# Patient Record
Sex: Female | Born: 1970 | Race: White | Hispanic: No | Smoking: Never smoker
Health system: Southern US, Community
[De-identification: ages and names within clinical notes are randomized; demographics above are authoritative.]

## PROBLEM LIST (undated history)

## (undated) DIAGNOSIS — F32A Depression, unspecified: Secondary | ICD-10-CM

## (undated) DIAGNOSIS — F419 Anxiety disorder, unspecified: Secondary | ICD-10-CM

## (undated) DIAGNOSIS — K219 Gastro-esophageal reflux disease without esophagitis: Secondary | ICD-10-CM

## (undated) DIAGNOSIS — F329 Major depressive disorder, single episode, unspecified: Secondary | ICD-10-CM

## (undated) HISTORY — DX: Major depressive disorder, single episode, unspecified: F32.9

## (undated) HISTORY — PX: ABDOMINAL HYSTERECTOMY: SHX81

## (undated) HISTORY — DX: Depression, unspecified: F32.A

## (undated) HISTORY — PX: CHOLECYSTECTOMY: SHX55

## (undated) HISTORY — DX: Gastro-esophageal reflux disease without esophagitis: K21.9

## (undated) HISTORY — DX: Anxiety disorder, unspecified: F41.9

---

## 2001-06-06 ENCOUNTER — Other Ambulatory Visit: Admission: RE | Admit: 2001-06-06 | Discharge: 2001-06-06 | Payer: Self-pay | Admitting: Family Medicine

## 2001-09-15 ENCOUNTER — Other Ambulatory Visit: Admission: RE | Admit: 2001-09-15 | Discharge: 2001-09-15 | Payer: Self-pay | Admitting: Obstetrics & Gynecology

## 2001-10-31 ENCOUNTER — Ambulatory Visit: Admission: RE | Admit: 2001-10-31 | Discharge: 2001-10-31 | Payer: Self-pay | Admitting: Gynecology

## 2001-11-03 ENCOUNTER — Encounter: Payer: Self-pay | Admitting: Gynecology

## 2001-11-07 ENCOUNTER — Encounter (INDEPENDENT_AMBULATORY_CARE_PROVIDER_SITE_OTHER): Payer: Self-pay | Admitting: Specialist

## 2001-11-07 ENCOUNTER — Inpatient Hospital Stay (HOSPITAL_COMMUNITY): Admission: RE | Admit: 2001-11-07 | Discharge: 2001-11-09 | Payer: Self-pay | Admitting: Obstetrics & Gynecology

## 2001-12-20 ENCOUNTER — Ambulatory Visit: Admission: RE | Admit: 2001-12-20 | Discharge: 2001-12-20 | Payer: Self-pay | Admitting: Gynecology

## 2002-01-19 ENCOUNTER — Encounter: Payer: Self-pay | Admitting: Obstetrics and Gynecology

## 2002-01-19 ENCOUNTER — Ambulatory Visit (HOSPITAL_COMMUNITY): Admission: RE | Admit: 2002-01-19 | Discharge: 2002-01-19 | Payer: Self-pay | Admitting: Obstetrics and Gynecology

## 2002-03-21 ENCOUNTER — Ambulatory Visit: Admission: RE | Admit: 2002-03-21 | Discharge: 2002-03-21 | Payer: Self-pay | Admitting: Gynecology

## 2002-03-21 ENCOUNTER — Other Ambulatory Visit: Admission: RE | Admit: 2002-03-21 | Discharge: 2002-03-21 | Payer: Self-pay | Admitting: Gynecology

## 2002-03-21 ENCOUNTER — Encounter (INDEPENDENT_AMBULATORY_CARE_PROVIDER_SITE_OTHER): Payer: Self-pay | Admitting: *Deleted

## 2002-06-12 ENCOUNTER — Other Ambulatory Visit: Admission: RE | Admit: 2002-06-12 | Discharge: 2002-06-12 | Payer: Self-pay | Admitting: Obstetrics & Gynecology

## 2002-07-11 ENCOUNTER — Encounter (INDEPENDENT_AMBULATORY_CARE_PROVIDER_SITE_OTHER): Payer: Self-pay | Admitting: Specialist

## 2002-07-11 ENCOUNTER — Ambulatory Visit: Admission: RE | Admit: 2002-07-11 | Discharge: 2002-07-11 | Payer: Self-pay | Admitting: Gynecology

## 2002-09-11 ENCOUNTER — Other Ambulatory Visit: Admission: RE | Admit: 2002-09-11 | Discharge: 2002-09-11 | Payer: Self-pay | Admitting: Gynecology

## 2002-09-11 ENCOUNTER — Ambulatory Visit: Admission: RE | Admit: 2002-09-11 | Discharge: 2002-09-11 | Payer: Self-pay | Admitting: Gynecology

## 2002-09-11 ENCOUNTER — Encounter (INDEPENDENT_AMBULATORY_CARE_PROVIDER_SITE_OTHER): Payer: Self-pay | Admitting: *Deleted

## 2009-07-14 ENCOUNTER — Encounter: Admission: RE | Admit: 2009-07-14 | Discharge: 2009-07-14 | Payer: Self-pay | Admitting: Family Medicine

## 2009-09-27 ENCOUNTER — Ambulatory Visit: Payer: Self-pay | Admitting: Family Medicine

## 2009-09-27 ENCOUNTER — Observation Stay (HOSPITAL_COMMUNITY): Admission: EM | Admit: 2009-09-27 | Discharge: 2009-09-28 | Payer: Self-pay | Admitting: Family Medicine

## 2009-11-22 ENCOUNTER — Inpatient Hospital Stay (HOSPITAL_COMMUNITY): Admission: EM | Admit: 2009-11-22 | Discharge: 2009-11-26 | Payer: Self-pay | Admitting: Emergency Medicine

## 2009-11-24 ENCOUNTER — Encounter (INDEPENDENT_AMBULATORY_CARE_PROVIDER_SITE_OTHER): Payer: Self-pay

## 2010-09-14 LAB — HEPATIC FUNCTION PANEL
ALT: 328 U/L — ABNORMAL HIGH (ref 0–35)
Albumin: 3.3 g/dL — ABNORMAL LOW (ref 3.5–5.2)
Bilirubin, Direct: 0.2 mg/dL (ref 0.0–0.3)
Indirect Bilirubin: 0.4 mg/dL (ref 0.3–0.9)
Total Bilirubin: 0.6 mg/dL (ref 0.3–1.2)
Total Protein: 6.5 g/dL (ref 6.0–8.3)

## 2010-09-14 LAB — AMYLASE
Amylase: 442 U/L — ABNORMAL HIGH (ref 0–105)
Amylase: 806 U/L — ABNORMAL HIGH (ref 0–105)
Amylase: 85 U/L (ref 0–105)

## 2010-09-14 LAB — COMPREHENSIVE METABOLIC PANEL
ALT: 540 U/L — ABNORMAL HIGH (ref 0–35)
ALT: 1035 U/L — ABNORMAL HIGH (ref 0–35)
ALT: 760 U/L — ABNORMAL HIGH (ref 0–35)
AST: 515 U/L — ABNORMAL HIGH (ref 0–37)
Albumin: 3 g/dL — ABNORMAL LOW (ref 3.5–5.2)
Albumin: 3.2 g/dL — ABNORMAL LOW (ref 3.5–5.2)
Albumin: 3.4 g/dL — ABNORMAL LOW (ref 3.5–5.2)
Alkaline Phosphatase: 202 U/L — ABNORMAL HIGH (ref 39–117)
Alkaline Phosphatase: 168 U/L — ABNORMAL HIGH (ref 39–117)
Alkaline Phosphatase: 168 U/L — ABNORMAL HIGH (ref 39–117)
Alkaline Phosphatase: 172 U/L — ABNORMAL HIGH (ref 39–117)
BUN: 3 mg/dL — ABNORMAL LOW (ref 6–23)
BUN: 5 mg/dL — ABNORMAL LOW (ref 6–23)
CO2: 26 mEq/L (ref 19–32)
CO2: 27 mEq/L (ref 19–32)
Calcium: 8.2 mg/dL — ABNORMAL LOW (ref 8.4–10.5)
Calcium: 8.5 mg/dL (ref 8.4–10.5)
Creatinine, Ser: 0.65 mg/dL (ref 0.4–1.2)
GFR calc Af Amer: >60 mL/min (ref >60)
GFR calc Af Amer: >60 mL/min (ref >60)
GFR calc non Af Amer: >60 mL/min (ref >60)
GFR calc non Af Amer: >60 mL/min (ref >60)
GFR calc non Af Amer: >60 mL/min (ref >60)
Glucose, Bld: 104 mg/dL — ABNORMAL HIGH (ref 70–99)
Glucose, Bld: 110 mg/dL — ABNORMAL HIGH (ref 70–99)
Glucose, Bld: 122 mg/dL — ABNORMAL HIGH (ref 70–99)
Glucose, Bld: 99 mg/dL (ref 70–99)
Potassium: 3.6 mEq/L (ref 3.5–5.1)
Potassium: 3.8 mEq/L (ref 3.5–5.1)
Sodium: 134 mEq/L — ABNORMAL LOW (ref 135–145)
Sodium: 136 mEq/L (ref 135–145)
Sodium: 139 mEq/L (ref 135–145)
Total Bilirubin: 1.2 mg/dL (ref 0.3–1.2)
Total Bilirubin: 1.8 mg/dL — ABNORMAL HIGH (ref 0.3–1.2)
Total Protein: 5.7 g/dL — ABNORMAL LOW (ref 6.0–8.3)
Total Protein: 5.9 g/dL — ABNORMAL LOW (ref 6.0–8.3)
Total Protein: 6.1 g/dL (ref 6.0–8.3)
Total Protein: 6.3 g/dL (ref 6.0–8.3)

## 2010-09-14 LAB — CBC
HCT: 34.1 % — ABNORMAL LOW (ref 36.0–46.0)
Hemoglobin: 13 g/dL (ref 12.0–15.0)
Hemoglobin: 13.7 g/dL (ref 12.0–15.0)
MCHC: 34.7 g/dL (ref 30.0–36.0)
RBC: 3.73 MIL/uL — ABNORMAL LOW (ref 3.87–5.11)
RBC: 4.07 MIL/uL (ref 3.87–5.11)
RBC: 4.35 MIL/uL (ref 3.87–5.11)
RDW: 13.4 % (ref 11.5–15.5)
RDW: 13.1 % (ref 11.5–15.5)
RDW: 13.4 % (ref 11.5–15.5)
WBC: 5.5 10*3/uL (ref 4.0–10.5)
WBC: 7.8 10*3/uL (ref 4.0–10.5)

## 2010-09-14 LAB — LIPASE, BLOOD
Lipase: 1488 U/L — ABNORMAL HIGH (ref 11–59)
Lipase: 37 U/L (ref 11–59)

## 2010-09-14 LAB — DIFFERENTIAL
Basophils Absolute: 0 10*3/uL (ref 0.0–0.1)
Basophils Absolute: 0 10*3/uL (ref 0.0–0.1)
Basophils Relative: 0 % (ref 0–1)
Basophils Relative: 0 % (ref 0–1)
Eosinophils Absolute: 0 10*3/uL (ref 0.0–0.7)
Lymphocytes Relative: 27 % (ref 12–46)
Neutrophils Relative %: 62 % (ref 43–77)

## 2010-09-16 LAB — APTT: aPTT: 29 seconds (ref 24–37)

## 2010-09-16 LAB — CBC
Hemoglobin: 12.5 g/dL (ref 12.0–15.0)
MCV: 91 fL (ref 78.0–100.0)
Platelets: 288 10*3/uL (ref 150–400)
RBC: 4.1 MIL/uL (ref 3.87–5.11)
RDW: 13 % (ref 11.5–15.5)
WBC: 7.9 10*3/uL (ref 4.0–10.5)

## 2010-09-16 LAB — COMPREHENSIVE METABOLIC PANEL
ALT: 918 U/L — ABNORMAL HIGH (ref 0–35)
AST: 766 U/L — ABNORMAL HIGH (ref 0–37)
Albumin: 3.3 g/dL — ABNORMAL LOW (ref 3.5–5.2)
CO2: 26 mEq/L (ref 19–32)
Creatinine, Ser: 0.67 mg/dL (ref 0.4–1.2)
GFR calc Af Amer: >60 mL/min (ref >60)
Glucose, Bld: 86 mg/dL (ref 70–99)
Potassium: 3 mEq/L — ABNORMAL LOW (ref 3.5–5.1)

## 2010-09-16 LAB — LIPASE, BLOOD: Lipase: 727 U/L — ABNORMAL HIGH (ref 11–59)

## 2010-09-16 LAB — PROTIME-INR: INR: 1.13 (ref 0.00–1.49)

## 2010-11-13 NOTE — Consult Note (Signed)
   NAME:  Rhonda Green, Rhonda Green                           ACCOUNT NO.:  [id number]   MEDICAL RECORD NO.:  [id number]                   PATIENT TYPE:  OUT   LOCATION:  GYN                                  FACILITY:  [facility]   PHYSICIAN:  De Blanch, M.D.         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  She had a radical hysterectomy in 5/03.  All lymph nodes were  negative.  She returns today for repeat Pap smear.  She had seen Dr. Ilda Mori in 1/04, who had a Pap smear that showed high-grade squamous  intraepithelial lesion.  Subsequently, she underwent colposcopy and no  lesions were noted.  She was also found to have a hyperpigmented lesion on  her vulva which was biopsied at that time and showed a benign vulvar  melanosis.   The patient reports she has had no difficulties following the vulvar biopsy.   Overall, she is doing well.   PHYSICAL EXAMINATION:  VITAL SIGNS:  Weight 155 pounds.  Blood pressure  118/72.  GENERAL:  The patient is a healthy white female in no acute distress.  HEENT:  Negative.  NECK:  Supple without thyromegaly.  There are no supraclavicular or inguinal  adenopathy.  ABDOMEN:  Soft, nontender, no masses, organomegaly, ascites, or hernias are  noted.  PELVIC:  EGBUS shows melanosis of the introitus.  The vagina is otherwise  clean, well supported, no lesions noted.  Pap smears are obtained.  Bimanual  examination reveals no masses or nodularity.   IMPRESSION:  Stage IA1 squamous cell carcinoma of the cervix, status post  radical hysterectomy in 5/03, no evidence of recurrent disease.   Pap smear three months ago showing high-grade SIL changes.  Pap smear is  repeated today.   PLAN:  We will  arrange followup depending upon the results of the Pap smear.  If they are normal, we will ask her to return to see Dr, Arlyce Dice in four  months, if they are abnormal, we will have her return for a repeat  colposcopy.                                               De Blanch, M.D.    DC/MEDQ  D:  09/11/2002  T:  09/12/2002  Job:  [id number]   cc:   Ilda Mori, M.D.  [address], [address]  [place], [place] [address]  Fax: [phone]   Telford Nab, R.N.

## 2010-11-13 NOTE — Consult Note (Signed)
NAME:  Rhonda Green, Rhonda Green                           ACCOUNT NO.:  [id number]   MEDICAL RECORD NO.:  [id number]                   PATIENT TYPE:  OUT   LOCATION:  GYN                                  FACILITY:  [facility]   PHYSICIAN:  Daniel L. Clarke-Pearson, M.D.      DATE OF BIRTH:  [birth year]   DATE OF CONSULTATION:  03/21/2002  DATE OF DISCHARGE:                                   CONSULTATION   REASON FOR CONSULTATION:  The patient is a 40 year old white female who  returns for continuing followup of stage IA1 squamous cell carcinoma of the  cervix.  She underwent a radical hysterectomy in 5/03, predominately because  she had lymph vascular space involvement.  She had negative lymph nodes and  no residual tumor in the cervix except for carcinoma in situ.  She has been  followed since that time with no evidence of recurrent disease.  Since her  last visit she has done well from a gynecologic point of view, although she  reports that she had an ovarian cyst on the right side that caused a  significant amount of pain.  This has now resolved.   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  The patient recently had  a blister on the heel of her foot from a new pair of shoes, and then  developed erythema.  She has wrapped the ankle to give her some additional  support, but has not taken any antibiotics.  She denies any fever or chills,  although she does recognize erythema and increased warmth in the area of the  ankle and lower leg.   In addition, the patient had previously been on Demulen for contraception,  but it also helps significant reduced her acne.  She has had a re-flare of  the acne since discontinuing the Demulen following her radical hysterectomy.   PAST MEDICAL HISTORY:  Chronic left lower extremity lymphedema.   PAST SURGICAL HISTORY:  1. Cesarean section x2.  2. Radical hysterectomy in 2003.   ALLERGIES:  No known drug allergies.   FAMILY HISTORY:  Negative for gynecologic, breast, or colon cancer.  The  patient's grandmother had cervix cancer.   SOCIAL HISTORY:  The patient is married.  She does not smoke.   CURRENT MEDICATIONS:  None.   PHYSICAL EXAMINATION:  VITAL SIGNS:  Weight 146 pounds, height 5 feet 8  inches, blood pressure 120/70.  GENERAL:  The patient is a healthy white female.  HEENT:  Fairly significant acne.  ABDOMEN:  Soft, nontender, no masses, organomegaly, ascites, or hernias are  noted.  The transverse incision is well-healed.  PELVIC:  EGBUS, vagina, bladder, and urethra are normal.  Cervix and uterus  are surgically absent.  Adnexa without masses.  Rectovaginal examination  confirms.  EXTREMITIES:  The left lower extremity has moderate lymphedema.  She does  have a considerable amount of  erythema in the ankle.   IMPRESSION:  1. Cervical cancer, no evidence of recurrent disease, Pap smears are     obtained.  2. Re-flare of her acne.  The patient is given a prescription for Cleocin-T     to use b.i.d.  3. She has cellulitis in her lower extremity.  I have given her a     prescription for Keflex 500 mg q.i.d. x10 days.  4. She is also given the name of a family physician who she should contact     to re-establish medical care.  5. She will return to see Dr. Arlyce Dice in three months, and return to see [place]     in six months.                                               Daniel L. Stanford Breed, M.D.    DLC/MEDQ  D:  03/21/2002  T:  03/21/2002  Job:  [id number]   cc:   Ilda Mori, M.D.  [address]  [place], [place] [address]  Fax: [phone]   Telford Nab, R.N.

## 2010-11-13 NOTE — Consult Note (Signed)
[facility]  Patient:    BEAUX, VERNE Visit Number: [id number] MRN: [id number]          Service Type: GON Location: GYN Attending Physician:  Jeannette Corpus Dictated by:   Rande Brunt. Clarke-Pearson, M.D. Proc. Date: 10/31/01 Admit Date:  10/31/2001   CC:         Gerlene Burdock D. Arlyce Dice, M.D.  Alesia Richards, R.N.   Consultation Report  A 40 year old white female seen in consultation at the request of Dr. Ilda Mori regarding management of newly diagnosed cervical carcinoma.  The patient reports that she has had annual Pap smears which have been normal until a recent Pap smear showed high grade intraepithelial squamous lesion. 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 There was associated lymph vascular space involvement and the margin was positive with invasive carcinoma.  A deeper lesion cannot be excluded.  The patient has had an uncomplicated postoperative course following the LEEP procedure.  PAST MEDICAL HISTORY:  Idiopathic left lower extremity lymphedema.  Age of onset was 17 years.  The patient does not actively treat this as she found that compression stockings were of no help.  PAST SURGICAL HISTORY:  Cesarean section x2.  ALLERGIES:  None.  CURRENT MEDICATIONS:  Demulen.  FAMILY HISTORY:  Negative for colon, breast, or ovarian cancer.  She had a grandmother who had cervical cancer.  SOCIAL HISTORY:  The patient is married.  She is a Merchandiser, retail at [employer], clinical (histocompatibility and immunogenetics).  OBSTETRICAL HISTORY:  Gravida 2.  REVIEW OF SYSTEMS:  Essentially negative.  PHYSICAL EXAMINATION  VITAL SIGNS:  Height 5 feet 8 inches, weight 145 pounds.  GENERAL:  The patient is a healthy white female in no acute distress.  HEENT:   Negative.  NECK:  Supple without thyromegaly.  There is no supraclavicular or inguinal adenopathy.  ABDOMEN:  Soft and nontender.  No mass, organomegaly, ascites, or hernias are noted.  She has a well healed Pfannenstiel incision.  PELVIC:  EGBUS normal.  Vagina is clean.  Cervix is normal and recovering and healing from the LEEP procedure.  Uterus is anterior, normal shape, size, consistency.  No adnexal masses.  No parametrial involvement.  Rectovaginal examination confirms.  IMPRESSION:  Patient has a squamous cell carcinoma of the cervix with lymph vascular space invasion.  Depth of invasion would categorize the patient as a stage IA 1.  However, she has positive margin with invasive lesion high in the endocervical canal and extensive carcinoma in situ.  I had a lengthy discussion with the patient and her husband regarding management options.  I believe because of the lymph vascular space involvement and the uncertainty as to the total tumor volume and depth of invasion I would recommend she undergo a radical hysterectomy or be treated with radiation therapy.  The pros and cons, the risks and benefits of each were talked about at length.  The patient is particularly concerned regarding her lymphedema and whether a lymphadenectomy might worsen that problem.  This is certainly a reasonable question and certainly we do not have any definitive answer, although I would be concerned also that her lymphedema might be aggravated by a pelvic lymphadenectomy.  After a lengthy discussion regarding management options we have agreed to proceed with a radical hysterectomy evaluating the cervix intraoperatively with a pathologist to  more clearly define true tumor volume and depth of invasion.  If this seems to be superficially invasive then we will defer lymphadenectomy on the left side in hopes of avoiding lymphedema.  The patient is aware that in this setting there is approximately a 5%  chance that we would miss a metastasis to a lymph node which then might progress over a period of time requiring subsequent therapy, not all of which would be curative.  She will consider this further but at the present time feels comfortable with this decision to modify from the standard of care.  The risks of surgery including hemorrhage, infection, injury to adjacent viscera, thromboembolic complications, as well as the risks of bladder dysfunction and the need for a suprapubic catheter were discussed with the patient at length. All of her questions are answered and she wishes to proceed with surgery which will be scheduled in conjunction with Dr. Ilda Mori on Nov 07, 2001. Dictated by:   Rande Brunt. Clarke-Pearson, M.D. Attending Physician:  Jeannette Corpus DD:  10/31/01 TD:  11/01/01 Job: [id number]

## 2010-11-13 NOTE — Op Note (Signed)
[facility]  Patient:    Rhonda Green, Rhonda Green Visit Number: [id number] MRN: [id number]          Service Type: GYN Location: 4W [id number] [id number] Attending Physician:  Lars Pinks Dictated by:   Rande Brunt. Clarke-Pearson, M.D. Proc. Date: 11/06/01 Admit Date:  11/07/2001   CC:         Gerlene Burdock D. Arlyce Dice, M.D.  Telford Nab, R.N.   Operative Report  PREOPERATIVE DIAGNOSIS:  Stage IA1 squamous cell carcinoma of the cervix with lymph vascular space involvement and positive conization margin.  POSTOPERATIVE DIAGNOSIS:  Stage IA1 squamous cell carcinoma of the cervix with lymph vascular space involvement and positive conization margin.  PROCEDURE:  Radical hysterectomy, right pelvic lymphadenectomy, suprapubic catheter placement.  SURGEON:  Daniel L. Clarke-Pearson, M.D.  ASSISTANTS:  1. Richard D. Arlyce Dice, M.D.  2. Telford Nab, R.N.  ANESTHESIA:  General with oral tracheal tube.  ESTIMATED BLOOD LOSS:  350 cc.  SURGICAL FINDINGS:  At exploratory laparotomy, the pelvic and periaortic lymph nodes were normal to palpation. The uterus was normal in size, the tubes and ovaries were normal and were preserved. On frozen section, there was no gross evidence of disease in the cervix. Exploration of the upper abdomen revealed no evidence of metastatic disease or abnormality.  The patient had underlying left lower extremity idiopathic lymphedema and as had been our preoperative plan, we elected to avoid doing a lymphadenectomy on this side in hopes of avoiding further lymphedema disability.  DESCRIPTION OF PROCEDURE:  The patient was brought to the operating room and after satisfactory attainment of general anesthesia, the patient in a modified lithotomy position in [place] stirrups. The anterior abdominal wall, perineum and vagina were prepped with Betadine, a Foley catheter was placed and the patient was draped. The abdomen was entered through a former  Pfannenstiel incision. The previous scar was excised along with a small amount of the patients panniculus. The abdomen and pelvis were explored with the above noted findings. The Bookwalter retractor was assembled and care was taken to avoid pressure on the psoas muscle. The bowel was packed out of the pelvis. The uterus was grasped with large Kelly clamps, the round ligaments were divided and the retroperitoneal space was opened developing the paravesical and pararectal spaces. All of the important anatomy was identified. The superior vesical artery was traced back to the uterine artery which was then doubly clipped, clamped and the distal portion free tied using 2-0 silk. The cardinal ligament was then further developed and divided using endoGIA stapler. The uterine ovarian anastomosis and fallopian tube were cross clamped, divided, free tied and suture ligated thus preserving the tube and ovary. A similar procedure was performed on both sides of the pelvis. The ureter was then mobilized from its attachments to the medial peritoneum until it entered the paracervical tunnel. The rectovaginal septum was opened and the uterosacral ligaments skeletonized. The uterosacral ligaments were then divided using the endoGIA stapler. The bladder flap was advanced to approximately 3 cm beyond the cervix. The ureter was then mobilized from the paracervical tunnel. A right angled clamp was used to dissect the ureter free from the anterior vesicouterine ligament. This was clamped, cut, and suture ligated. With the ureter mobilized until it entered the bladder, it was mobilized laterally and the posterior vesicouterine ligament was clamped, cut, and suture ligated. The paravaginal tissues were clamped, cut and suture ligated. The vaginal angles were cross clamped and the vagina transected approximately a centimeter from the cervix  circumferentially. The vaginal angles were transfixed with #0 Vicryl,  central portion closed with a running locking suture of #0 Vicryl. A Halban culdoplasty was then performed using #0 Vicryl to obliterate the posterior cul-de-sac and rectovaginal septum and to achieve hemostasis.  A pelvic lymphadenectomy was performed on the right side of the pelvis excising all the lymph nodes from the external iliac artery and vein, hypogastric vessels and the obturator space. Care was taken to avoid vascular injury and injury to the obturator nerve. Node dissection was only performed on the right side because the patient had left lower extremity lymphedema which is idiopathic. A frozen section was obtained and it was found that there was no gross residual disease in the uterus and cervix therefore making a formal lymphadenectomy less likely to yield any positive lymph nodes. The patient had been counselled preoperatively and agreed to this deviation from the usual standard of care.  The retroperitoneal space was opened, the dome of the bladder was incised and a suprapubic catheter was brought in through a stab incision over the mons. The balloon was inflated and the bladder closed in two layers, the first being a running closure of the muscle and mucosa, the second being an embrocating suture of the muscle.  The abdomen and pelvis were irrigated with saline. Hemostasis was ascertained, retractors and packs removed. The ovaries were sutured to the lateral pelvic sidewall to move them out of the pelvis and off away from the vaginal cuff. The anterior abdominal wall was then closed in layers the first being a running suture of the peritoneum using  2-0 Vicryl, subcutaneous tissue was irrigated, hemostasis achieved. The fascia was closed with a running suture of #0 PDS. The subcutaneous tissue was again irrigated, hemostasis achieved with cautery and the skin was closed with running subcuticular suture of 3-0 Vicryl. Steri-Strips were applied, a dressing was applied,  the patient was  awakened from anesthesia and taken to the recovery room in satisfactory condition. Sponge, needle and instrument counts were correct x2. Dictated by:   Rande Brunt. Clarke-Pearson, M.D. Attending Physician:  Lars Pinks DD:  11/07/01 TD:  11/07/01 Job: [id number]

## 2010-11-13 NOTE — Consult Note (Signed)
[facility]  Patient:    Rhonda Green, Rhonda Green Visit Number: [id number] MRN: [id number]          Service Type: GON Location: GYN Attending Physician:  Jeannette Corpus Dictated by:   Rande Brunt. Clarke-Pearson, M.D. Proc. Date: 12/20/01 Admit Date:  12/20/2001 Discharge Date: 12/20/2001   CC:         Gerlene Burdock D. Arlyce Dice, M.D.  Telford Nab, R.N.   Consultation Report  REASON FOR FOLLOWUP:  [age]-year-old white female returns for six-week postoperative checkup, having undergone a radical hysterectomy and pelvic lymphadenectomy on Nov 07, 2001 for a stage IA1 squamous cell carcinoma of the cervix with lymphvascular space involvement.  Final pathology showed no residual cancer, although she did have carcinoma in situ in the residual cervix.  Right pelvic lymph nodes were negative for metastatic disease.  Left pelvic lymph nodes were not removed because of the patients idiopathic lymphedema on that leg.  The patient has had an uncomplicated postoperative course and feels that she has recovered fully.  She has good bladder function, although there is some slight decrease in sensation.  PHYSICAL EXAMINATION:  ABDOMEN:  The abdomen is soft and nontender.  Incision is well-healed. Suprapubic site is well-healed.  She does have interestingly some erythema within her striae of her lower abdomen.  This does not appear to be a cellulitis.  The patient reports that this occurred when she lifted some lawn furniture recently.  It has not gotten worse over the last several days.  PELVIC:  EGBUS normal.  Vagina is clean and well-supported.  No lesions are noted.  Bimanual and rectovaginal exam reveal no masses, induration or nodularity.  IMPRESSION: 1. Stage IA1 squamous cell carcinoma of the cervix, no evidence of residual    disease in the radical hysterectomy specimen. 2. Erythema of the striae from questionable etiology.  On the outside chance  she does have some unusual lymphangitis or cellulitis, the patient is    placed on Keflex 500 mg q.i.d. for the next five days.  If this worsens,    she will contact us. 3. She will return in three months for a followup visit and then we will begin    alternating visits with Dr. Caralyn Guile. Arlyce Dice, her primary gynecologist. Dictated by:   Rande Brunt. Clarke-Pearson, M.D. Attending Physician:  Jeannette Corpus DD:  12/20/01 TD:  12/22/01 Job: [id number]

## 2010-11-13 NOTE — Consult Note (Signed)
NAME:  Rhonda Green, Rhonda Green                           ACCOUNT NO.:  [id number]   MEDICAL RECORD NO.:  [id number]                   PATIENT TYPE:  OUT   LOCATION:  GYN                                  FACILITY:  [facility]   PHYSICIAN:  De Blanch, M.D.         DATE OF BIRTH:  07-05-1970   DATE OF CONSULTATION:  07/11/2002  DATE OF DISCHARGE:                                   CONSULTATION   A 40 year old white female returns for continuing follow-up and  reevaluation.  She had a stage IA 1 squamous cell carcinoma of the cervix  treated with radical hysterectomy May of 2003.  She had negative nodes and  no residual tumor in the cervix except for carcinoma in situ.  She recently  saw Ilda Mori, M.D. who noted some hyperpigmented lesions at her  introitus.  In addition, her Pap smear showed high grade squamous  intraepithelial lesion.  The patient is entirely asymptomatic.  Denies any  pelvic pain, pressure, vaginal bleeding or discharge.  Functional status is  excellent.   REVIEW OF SYSTEMS:  Otherwise negative.   PAST SURGICAL HISTORY:  Cesarean section and radical hysterectomy.   PAST MEDICAL HISTORY:  Chronic left lower extremity idiopathic lymphedema.   ALLERGIES:  None.   OB HISTORY:  Gravida 2.   PHYSICAL EXAMINATION:  VITAL SIGNS:  Weight 103 pounds, blood pressure  102/70.  GENERAL:  The patient is a healthy white female in no acute distress.  HEENT:  Negative.  NECK:  Supple without thyromegaly.  LYMPH:  There is no supraclavicular or inguinal adenopathy.  ABDOMEN:  Soft, nontender.  No mass, organomegaly, ascites, or hernias are  noted.  Her Pfannenstiel incision is well healed.  PELVIC:  EGBUS is normal.  The introitus has a hyperpigmented horseshoe  shaped area right at the introitus.  This extends from approximately 3  o'clock to 9 o'clock.  There are no other lesions noted.  The vagina is  grossly normal and well supported.   PROCEDURE NOTE:  Colposcopic  examination of the vagina is performed in its  entirety.  I do not see any lesions using acetic acid and a Green filter  light.   Lugol solution is applied and no lesions are noted either.   Under 1% Xylocaine anesthesia a representative biopsy of the hyperpigmented  lesion at the introitus is removed with a punch biopsy and submitted to  pathology.  Silver nitrate is used to achieve hemostasis.   IMPRESSION:  Stage IA 1 squamous cell carcinoma of the cervix status post  radical hysterectomy.   Recent Pap smear showing high grade SIL lesion with no obvious findings on  colposcopy or Lugol staining.  We will plan on repeating her Pap smear again  in early March.   Hyperpigmented lesions at the introitus.  I believe this is most likely  melanosis; however, we will await final biopsy reports before making any  further  recommendations or plans.  The patient will return to see me in  March.                                               De Blanch, M.D.    DC/MEDQ  D:  07/11/2002  T:  07/11/2002  Job:  [id number]   cc:   Ilda Mori, M.D.  [address]  [place], [place] [address]  Fax: [phone]   Telford Nab, R.N.  [address] [place], [place] [address]  Fax: 1

## 2010-11-13 NOTE — Discharge Summary (Signed)
[facility]  Patient:    Rhonda Green, Rhonda Green Visit Number: [id number] MRN: [id number]          Service Type: GYN Location: 4W [phone] Attending Physician:  Lars Pinks Dictated by:   Caralyn Guile Arlyce Dice, M.D. Admit Date:  11/07/2001 Discharge Date: 11/09/2001   CC:         Reuel Boom L. Clarke-Pearson, M.D.  Telford Nab, R.N.   Discharge Summary  FINAL DIAGNOSIS:  Cervical cancer.  SECONDARY DIAGNOSIS:  Idiopathic lymphedema of the left leg.  PROCEDURE:  Radical hysterectomy with right pelvic node dissection.  COMPLICATIONS:  None.  CONDITION ON DISCHARGE:  Improved.  HISTORY OF PRESENT ILLNESS:  This is a 40 year old gravida 2, para 2 who was evaluated for an abnormal Pap with a LEEP cervical cone which revealed a 2.5 mm invasive cervical cancer with vascular space involvement.  She was referred to Dr. De Blanch where the decision was made to proceed with radical hysterectomy.  Because of the minimal amount of invasion and the history of the idiopathic lymphedema on the left, decision was made in the absence of gross tumor on frozen section at the time of surgery to proceed with a unilateral right pelvic lymphadenectomy.  The patient was taken to the operating room on the day of admission where a radical hysterectomy was performed.  Evaluation at the time of surgery by the pathologist revealed no evidence of gross tumor and, therefore, only a right pelvic lymphadenectomy was performed.  The patients postoperative course was benign without significant fever or anemia.  On the second postoperative day, the patient was ambulating well, tolerating a regular diet, and passing flatus.  The pain was well controlled with oral analgesics and her bladder was being drained with the use of a suprapubic catheter which was draining well.  It was therefore felt that she was ready for discharge.  She was discharged on a regular diet, told to  limit her activity.  She was given Tylox 30 tablets to take one to two every 4 hours for pain.  She was asked to take Motrin at first to see if this controlled the pain prior to taking the narcotic analgesic.  In addition, she was given Macrobid 10 tablets and asked to take one tablet daily for prophylaxis of urinary tract infection.  She was asked to return to Dr. Karie Kirks clinic in seven days for follow-up evaluation.  LABORATORY DATA:  Admission hemoglobin 13.7, white count 9100. Postoperatively, her hemoglobin was 11.1 with a white count of 11,000.  Her routine chemistries were all within normal limits, blood type O positive.  Her pathology report is pending at the time of this dictation. Dictated by:   Caralyn Guile Arlyce Dice, M.D. Attending Physician:  Lars Pinks DD:  11/09/01 TD:  11/13/01 Job: [id number]

## 2012-07-17 ENCOUNTER — Ambulatory Visit: Payer: BC Managed Care – PPO

## 2012-07-17 ENCOUNTER — Ambulatory Visit (INDEPENDENT_AMBULATORY_CARE_PROVIDER_SITE_OTHER): Payer: BC Managed Care – PPO | Admitting: Family Medicine

## 2012-07-17 VITALS — BP 117/80 | HR 97 | Temp 98.7°F | Resp 18 | Ht 69.0 in | Wt 144.2 lb

## 2012-07-17 DIAGNOSIS — K529 Noninfective gastroenteritis and colitis, unspecified: Secondary | ICD-10-CM

## 2012-07-17 DIAGNOSIS — R6883 Chills (without fever): Secondary | ICD-10-CM

## 2012-07-17 DIAGNOSIS — E860 Dehydration: Secondary | ICD-10-CM

## 2012-07-17 DIAGNOSIS — K5289 Other specified noninfective gastroenteritis and colitis: Secondary | ICD-10-CM

## 2012-07-17 DIAGNOSIS — R197 Diarrhea, unspecified: Secondary | ICD-10-CM

## 2012-07-17 DIAGNOSIS — R112 Nausea with vomiting, unspecified: Secondary | ICD-10-CM

## 2012-07-17 DIAGNOSIS — R109 Unspecified abdominal pain: Secondary | ICD-10-CM

## 2012-07-17 LAB — POCT CBC
Granulocyte percent: 79.9 %G (ref 37–80)
HCT, POC: 48.1 % — AB (ref 37.7–47.9)
Hemoglobin: 15 g/dL (ref 12.2–16.2)
Lymph, poc: 1.7 (ref 0.6–3.4)
MCH, POC: 29.4 pg (ref 27–31.2)
MCHC: 31.2 g/dL — AB (ref 31.8–35.4)
MCV: 94.3 fL (ref 80–97)
MID (cbc): 0.8 (ref 0–0.9)
MPV: 9.1 fL (ref 0–99.8)
POC Granulocyte: 10.2 — AB (ref 2–6.9)
POC LYMPH PERCENT: 13.5 % (ref 10–50)
POC MID %: 6.6 % (ref 0–12)
Platelet Count, POC: 387 10*3/uL (ref 142–424)
RBC: 5.1 M/uL (ref 4.04–5.48)
RDW, POC: 13.8 %
WBC: 12.8 10*3/uL — AB (ref 4.6–10.2)

## 2012-07-17 LAB — POCT INFLUENZA A/B
Influenza A, POC: NEGATIVE
Influenza B, POC: NEGATIVE

## 2012-07-17 MED ORDER — ONDANSETRON 4 MG PO TBDP
4.0000 mg | ORAL_TABLET | Freq: Once | ORAL | Status: AC
  Administered 2012-07-17: 4 mg via ORAL

## 2012-07-17 MED ORDER — PROMETHAZINE HCL 12.5 MG PO TABS: 12.5000 mg | ORAL_TABLET | Freq: Four times a day (QID) | ORAL | Status: DC | PRN

## 2012-07-17 MED ORDER — CIPROFLOXACIN HCL 500 MG PO TABS: 500.0000 mg | ORAL_TABLET | Freq: Two times a day (BID) | ORAL | Status: DC

## 2012-07-17 MED ORDER — METRONIDAZOLE 500 MG PO TABS: 500.0000 mg | ORAL_TABLET | Freq: Three times a day (TID) | ORAL | Status: DC

## 2012-07-17 NOTE — Progress Notes (Signed)
[facility]:  Office Visit  Chief Complaint:  Chief Complaint  Patient presents with  . Emesis    yesterday  . Chills  . Diarrhea    HPI: Rhonda Green is a 42 y.o. female who complains of nausea and vomiting starting 1 day ago. Monday morning throwing up, feeling really bad, uable to drink or eat. Last night had bubbles and pressure in chest and abdomen. + Chills. Loose stools. Possibly throwing up blood but unsure. She denies any flu contacts. She denies any fevers, bloody diarrhea. 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  No ear pain or muscle aches. Saturday night had food for takeout, [employer] chicken. + acid with fluid intake. No recent meds , no recent travels. No sick contacts other than what she does for work.    Past Medical History  Diagnosis Date  . Anxiety   . Depression    Past Surgical History  Procedure Date  . Coronary artery bypass graft   . Cesarean section   . Abdominal hysterectomy    History   Social History  . Marital Status: Married    Spouse Name: N/A    Number of Children: N/A  . Years of Education: N/A   Social History Main Topics  . Smoking status: Current Every Day Smoker -- 1.0 packs/day    Types: Cigarettes  . Smokeless tobacco: None  . Alcohol Use: No  . Drug Use: No  . Sexually Active: No   Other Topics Concern  . None   Social History Narrative  . None   Family History  Problem Relation Age of Onset  . Diabetes Mother   . Hypertension Mother   . Anuerysm Father    No Known Allergies Prior to Admission medications   Medication Sig Start Date End Date Taking? Authorizing Provider  DULoxetine (CYMBALTA) 60 MG capsule Take 60 mg by mouth daily.   Yes Historical Provider, MD  omeprazole (PRILOSEC) 20 MG capsule Take 20 mg by mouth daily.   Yes Historical Provider, MD     ROS: The patient denies night sweats, unintentional weight loss, chest pain,  palpitations, wheezing, dyspnea on exertion, dysuria, hematuria, melena, numbness,  or tingling.   All other systems have been reviewed and were otherwise negative with the exception of those mentioned in the HPI and as above.    PHYSICAL EXAM: Filed Vitals:   07/17/12 0942  BP: 117/80  Pulse: 97  Temp: 98.7 F (37.1 C)  Resp: 18   Filed Vitals:   07/17/12 0942  Height: 5\' 9"  (1.753 m)  Weight: 144 lb 3.2 oz (65.409 kg)   Body mass index is 21.29 kg/(m^2).  General: Alert, tired appearing white female HEENT:  Normocephalic, atraumatic, oropharynx patent. Dry oral mucosa. NO exudates, TM nl Cardiovascular:  Regular rate and rhythm, no rubs murmurs or gallops.  No Carotid bruits, radial pulse intact. No pedal edema.  Respiratory: Clear to auscultation bilaterally.  No wheezes, rales, or rhonchi.  No cyanosis, no use of accessory musculature GI: No organomegaly, abdomen is soft and non-tender, positive bowel sounds.  No masses. Skin: No rashes. Neurologic: Facial musculature symmetric. Psychiatric: Patient is appropriate throughout our interaction. Lymphatic: No cervical lymphadenopathy Musculoskeletal: Gait intact.   LABS: Results for orders placed in visit on 07/17/12  POCT CBC      Component Value Range   WBC 12.8 (*) 4.6 - 10.2 K/uL  Lymph, poc 1.7  0.6 - 3.4   POC LYMPH PERCENT 13.5  10 - 50 %L   MID (cbc) 0.8  0 - 0.9   POC MID % 6.6  0 - 12 %M   POC Granulocyte 10.2 (*) 2 - 6.9   Granulocyte percent 79.9  37 - 80 %G   RBC 5.10  4.04 - 5.48 M/uL   Hemoglobin 15.0  12.2 - 16.2 g/dL   HCT, POC 16.1 (*) 09.6 - 47.9 %   MCV 94.3  80 - 97 fL   MCH, POC 29.4  27 - 31.2 pg   MCHC 31.2 (*) 31.8 - 35.4 g/dL   RDW, POC 04.5     Platelet Count, POC 387  142 - 424 K/uL   MPV 9.1  0 - 99.8 fL  POCT INFLUENZA A/B      Component Value Range   Influenza A, POC Negative     Influenza B, POC Negative       EKG/XRAY:   Primary read interpreted by Dr[facility] Conley Rolls at [facility]. Nl  chest, nl abdomen   ASSESSMENT/PLAN: Encounter Diagnoses  Name Primary?  . Nausea and vomiting Yes  . Chills   . Diarrhea   . Gastroenteritis    Patient has multiple causes for gastroenteritis: chicken ingestion at [facility], well water, works with children Rx Promethazine, Cipro and Flagyl Patient will bring in stool sample when she is able to give it to [place] Feels ok after 2 bags IVF. DC home with friend F/u prn or to ER prn.   LE, THAO PHUONG, DO 07/17/2012 12:05 PM

## 2012-07-17 NOTE — Patient Instructions (Addendum)
Diet for Diarrhea, Adult Having frequent, runny stools (diarrhea) has many causes. Diarrhea may be caused or worsened by food or drink. Diarrhea may be relieved by changing your diet. IF YOU ARE NOT TOLERATING SOLID FOODS:  Drink enough water and fluids to keep your urine clear or pale yellow.  Avoid sugary drinks and sodas as well as milk-based beverages.  Avoid beverages containing caffeine and alcohol.  You may try rehydrating beverages. You can make your own by following this recipe:   tsp table salt.   tsp baking soda.   tsp salt substitute (potassium chloride).  1 tbs + 1 tsp sugar.  1 qt water. As your stools become more solid, you can start eating solid foods. Add foods one at a time. If a certain food causes your diarrhea to get worse, avoid that food and try other foods. A low fiber, low-fat, and lactose-free diet is recommended. Small, frequent meals may be better tolerated.  Starches  Allowed:  White, [place], and pita breads, plain rolls, buns, bagels. Plain muffins, matzo. Soda, saltine, or graham crackers. Pretzels, melba toast, zwieback. Cooked cereals made with water: cornmeal, farina, cream cereals. Dry cereals: refined corn, wheat, rice. Potatoes prepared any way without skins, refined macaroni, spaghetti, noodles, refined rice.  Avoid:  Bread, rolls, or crackers made with whole wheat, multi-grains, rye, bran seeds, nuts, or coconut. Corn tortillas or taco shells. Cereals containing whole grains, multi-grains, bran, coconut, nuts, or raisins. Cooked or dry oatmeal. Coarse wheat cereals, granola. Cereals advertised as "high-fiber." Potato skins. Whole grain pasta, wild or brown rice. Popcorn. Sweet potatoes/yams. Sweet rolls, doughnuts, waffles, pancakes, sweet breads. Vegetables  Allowed: Strained tomato and vegetable juices. Most well-cooked and canned vegetables without seeds. Fresh: Tender lettuce, cucumber without the skin, cabbage, spinach, bean  sprouts.  Avoid: Fresh, cooked, or canned: Artichokes, baked beans, beet greens, broccoli, Brussels sprouts, corn, kale, legumes, peas, sweet potatoes. Cooked: Green or red cabbage, spinach. Avoid large servings of any vegetables, because vegetables shrink when cooked, and they contain more fiber per serving than fresh vegetables. Fruit  Allowed: All fruit juices except prune juice. Cooked or canned: Apricots, applesauce, cantaloupe, cherries, fruit cocktail, grapefruit, grapes, kiwi, mandarin oranges, peaches, pears, plums, watermelon. Fresh: Apples without skin, ripe banana, grapes, cantaloupe, cherries, grapefruit, peaches, oranges, plums. Keep servings limited to  cup or 1 piece.  Avoid: Fresh: Apple with skin, apricots, mango, pears, raspberries, strawberries. Prune juice, stewed or dried prunes. Dried fruits, raisins, dates. Large servings of all fresh fruits. Meat and Meat Substitutes  Allowed: Ground or well-cooked tender beef, ham, veal, lamb, pork, or poultry. Eggs, plain cheese. Fish, oysters, shrimp, lobster, other seafoods. Liver, organ meats.  Avoid: Tough, fibrous meats with gristle. Peanut butter, smooth or chunky. Cheese, nuts, seeds, legumes, dried peas, beans, lentils. Milk  Allowed: Yogurt, lactose-free milk, kefir, drinkable yogurt, buttermilk, soy milk.  Avoid: Milk, chocolate milk, beverages made with milk, such as milk shakes. Soups  Allowed: Bouillon, broth, or soups made from allowed foods. Any strained soup.  Avoid: Soups made from vegetables that are not allowed, cream or milk-based soups. Desserts and Sweets  Allowed: Sugar-free gelatin, sugar-free frozen ice pops made without sugar alcohol.  Avoid: Plain cakes and cookies, pie made with allowed fruit, pudding, custard, cream pie. Gelatin, fruit, ice, sherbet, frozen ice pops. Ice cream, ice milk without nuts. Plain hard candy, honey, jelly, molasses, syrup, sugar, chocolate syrup, gumdrops,  marshmallows. Fats and Oils  Allowed: Avoid any fats and oils.  Avoid:  Seeds, nuts, olives, avocados. Margarine, butter, cream, mayonnaise, salad oils, plain salad dressings made from allowed foods. Plain gravy, crisp bacon without rind. Beverages  Allowed: Water, decaffeinated teas, oral rehydration solutions, sugar-free beverages.  Avoid: Fruit juices, caffeinated beverages (coffee, tea, soda or pop), alcohol, sports drinks, or lemon-lime soda or pop. Condiments  Allowed: Ketchup, mustard, horseradish, vinegar, cream sauce, cheese sauce, cocoa powder. Spices in moderation: allspice, basil, bay leaves, celery powder or leaves, cinnamon, cumin powder, curry powder, ginger, mace, marjoram, onion or garlic powder, oregano, paprika, parsley flakes, ground pepper, rosemary, sage, savory, tarragon, thyme, turmeric.  Avoid: Coconut, honey. Weight Monitoring: Weigh yourself every day. You should weigh yourself in the morning after you urinate and before you eat breakfast. Wear the same amount of clothing when you weigh yourself. Record your weight daily. Bring your recorded weights to your clinic visits. Tell your caregiver right away if you have gained 3 lb/1.4 kg or more in 1 day, 5 lb/2.3 kg in a week, or whatever amount you were told to report. SEEK IMMEDIATE MEDICAL CARE IF:   You are unable to keep fluids down.  You start to throw up (vomit) or diarrhea keeps coming back (persistent).  Abdominal pain develops, increases, or can be felt in one place (localizes).  You have an oral temperature above 102 F (38.9 C), not controlled by medicine.  Diarrhea contains blood or mucus.  You develop excessive weakness, dizziness, fainting, or extreme thirst. MAKE SURE YOU:   Understand these instructions.  Will watch your condition.  Will get help right away if you are not doing well or get worse. Document Released: 09/04/2003 Document Revised: 09/06/2011 Document Reviewed:  10/29/2011 [facility] Patient Information 2013 [place], [place]. B.R.A.T. Diet Your doctor has recommended the B.R.A.T. diet for you or your child until the condition improves. This is often used to help control diarrhea and vomiting symptoms. If you or your child can tolerate clear liquids, you may have:  Bananas.   Rice.   Applesauce.   Toast (and other simple starches such as crackers, potatoes, noodles).  Be sure to avoid dairy products, meats, and fatty foods until symptoms are better. Fruit juices such as apple, grape, and prune juice can make diarrhea worse. Avoid these. Continue this diet for 2 days or as instructed by your caregiver. Document Released: 06/14/2005 Document Revised: 06/03/2011 Document Reviewed: 12/01/2006 [facility] Patient Information 2012 [place], [place].

## 2012-07-18 ENCOUNTER — Telehealth: Payer: Self-pay | Admitting: Family Medicine

## 2012-07-18 LAB — COMPREHENSIVE METABOLIC PANEL
ALT: 32 U/L (ref 0–35)
AST: 26 U/L (ref 0–37)
Alkaline Phosphatase: 77 U/L (ref 39–117)
CO2: 27 mEq/L (ref 19–32)
Creat: 0.66 mg/dL (ref 0.50–1.10)
Sodium: 141 mEq/L (ref 135–145)
Total Bilirubin: 0.6 mg/dL (ref 0.3–1.2)
Total Protein: 6.9 g/dL (ref 6.0–8.3)

## 2012-07-18 LAB — COMPREHENSIVE METABOLIC PANEL WITH GFR
Albumin: 4.3 g/dL (ref 3.5–5.2)
BUN: 15 mg/dL (ref 6–23)
Calcium: 9.5 mg/dL (ref 8.4–10.5)
Chloride: 98 meq/L (ref 96–112)
Glucose, Bld: 51 mg/dL — ABNORMAL LOW (ref 70–99)
Potassium: 3.3 meq/L — ABNORMAL LOW (ref 3.5–5.3)

## 2012-07-18 NOTE — Telephone Encounter (Signed)
Patient doing much better. Told her her labs. Advise to take in a banana a day. HAs not taken abx, since she is doing much better then will hold off on abx unless she feels worse. HAs not been able to give stool sample. Will bring it in when she can give it to [place].

## 2012-07-21 LAB — OVA AND PARASITE SCREEN: OP: NONE SEEN

## 2012-07-24 ENCOUNTER — Telehealth: Payer: Self-pay

## 2012-07-24 LAB — STOOL CULTURE

## 2012-07-24 NOTE — Telephone Encounter (Signed)
Message copied by Johnnette Litter on Mon Jul 24, 2012  1:40 PM ------      Message from: Hamilton Capri P      Created: Mon Jul 24, 2012 11:19 AM       Hope she is feeling better. Stool sx are back and no growth.

## 2012-07-24 NOTE — Telephone Encounter (Signed)
Pt.notified

## 2012-07-24 NOTE — Telephone Encounter (Signed)
Message copied by Johnnette Litter on Mon Jul 24, 2012  1:38 PM ------      Message from: Lenell Antu      Created: Sat Jul 22, 2012 11:30 AM       Please let her know that her ova and parasite test was negative, have not received her stool cx results.             Thx       Tle

## 2012-07-31 ENCOUNTER — Ambulatory Visit (INDEPENDENT_AMBULATORY_CARE_PROVIDER_SITE_OTHER): Payer: BC Managed Care – PPO | Admitting: Emergency Medicine

## 2012-07-31 VITALS — BP 117/74 | HR 80 | Temp 98.4°F | Resp 18 | Wt 150.0 lb

## 2012-07-31 DIAGNOSIS — R197 Diarrhea, unspecified: Secondary | ICD-10-CM

## 2012-07-31 DIAGNOSIS — R109 Unspecified abdominal pain: Secondary | ICD-10-CM

## 2012-07-31 LAB — POCT CBC
Granulocyte percent: 70 %G (ref 37–80)
HCT, POC: 43.4 % (ref 37.7–47.9)
Hemoglobin: 14.2 g/dL (ref 12.2–16.2)
MPV: 8.5 fL (ref 0–99.8)
POC Granulocyte: 8.3 — AB (ref 2–6.9)
POC LYMPH PERCENT: 24.8 %L (ref 10–50)
RDW, POC: 13.7 %

## 2012-07-31 LAB — IBC PANEL: %SAT: 13 % — ABNORMAL LOW (ref 20–55)

## 2012-07-31 LAB — COMPREHENSIVE METABOLIC PANEL
ALT: 21 U/L (ref 0–35)
AST: 18 U/L (ref 0–37)
Albumin: 4.5 g/dL (ref 3.5–5.2)
Alkaline Phosphatase: 87 U/L (ref 39–117)
Glucose, Bld: 92 mg/dL (ref 70–99)
Potassium: 4 mEq/L (ref 3.5–5.3)
Sodium: 140 mEq/L (ref 135–145)
Total Protein: 7 g/dL (ref 6.0–8.3)

## 2012-07-31 LAB — IFOBT (OCCULT BLOOD): IFOBT: NEGATIVE

## 2012-07-31 NOTE — Patient Instructions (Addendum)
Crohn's Disease Crohn's disease is a long-term (chronic) soreness and redness (inflammation) of the intestines (bowel). It can affect any portion of the digestive tract, from the mouth to the anus. It can also cause problems outside the digestive tract. Crohn's disease is closely related to a disease called ulcerative colitis (together, these two diseases are called inflammatory bowel disease).  CAUSES  The cause of Crohn's disease is not known. One theory is that, in an easily affected (susceptible) person, the immune system is triggered to attack the body's own digestive tissue. Crohn's disease runs in families. It seems to be more common in certain geographic areas and amongst certain races. There are no clear-cut dietary causes.  SYMPTOMS  Crohn's disease can cause many different symptoms since it can affect many different parts of the body. Symptoms include:  Fatigue.  Weight loss.  Chronic diarrhea, sometime bloody.  Abdominal pain and cramps.  Fever.  Ulcers or canker sores in the mouth or rectum.  Anemia (low red blood cells).  Arthritis, skin problems, and eye problems may occur. Complications of Crohn's disease can include:  Series of holes (perforation) of the bowel.  Portions of the intestines sticking to each other (adhesions).  Obstruction of the bowel.  Fistula formation, typically in the rectal area but also in other areas. A fistula is an opening between the bowels and the outside, or between the bowels and another organ.  A painful crack in the mucous membrane of the anus (rectal fissure). DIAGNOSIS  Your caregiver may suspect Crohn's disease based on your symptoms and an exam. Blood tests may confirm that there is a problem. You may be asked to submit a stool specimen for examination. X-rays and CT scans may be necessary. Ultimately, the diagnosis is usually made after a procedure that uses a flexible tube that is inserted via your mouth or your anus. This is done  under sedation and is called either an upper endoscopy or colonoscopy. With these tests, the specialist can take tiny tissue samples and remove them from the inside of the bowel (biopsy). Examination of this biopsy tissue under a microscope can reveal Crohn's disease as the cause of your symptoms. Due to the many different forms that Crohn's disease can take, symptoms may be present for several years before a diagnosis is made. HOME CARE INSTRUCTIONS   There is no cure for Crohn's disease. The best treatment is frequent checkups with your caregiver.  Symptoms such as diarrhea can be controlled with medications. Avoid foods that have a laxative effect such as fresh fruit, vegetables and dairy products. During flare ups, you can rest your bowel by refraining from solid foods. Drink clear liquids frequently during the day (electrolyte or re-hydrating fluids are best. Your caregiver can help you with suggestions). Drink often to prevent loss of body fluids (dehydration). When diarrhea has cleared, eat small meals and more frequently. Avoid food additives and stimulants such as caffeine (coffee, tea, or chocolate). Enzyme supplements may help if you develop intolerance to a sugar in dairy products (lactose). Ask your caregiver or dietitian about specific dietary instructions.  Try to maintain a positive attitude. Learn relaxation techniques such as self hypnosis, mental imaging, and muscle relaxation.  If possible, avoid stresses which can aggravate your condition.  Exercise regularly.  Follow your diet.  Always get plenty of rest. SEEK MEDICAL CARE IF:   Your symptoms fail to improve after a week or two of new treatment.  You experience continued weight loss.  You have   ongoing crampy digestion or loose bowels.  You develop a new skin rash, skin sores, or eye problems. SEEK IMMEDIATE MEDICAL CARE IF:   You have worsening of your symptoms or develop new symptoms.  You have a fever.  You  develop bloody diarrhea.  You develop severe abdominal pain. MAKE SURE YOU:   Understand these instructions.  Will watch your condition.  Will get help right away if you are not doing well or get worse. Document Released: 03/24/2005 Document Revised: 09/06/2011 Document Reviewed: 02/20/2007 [employer] Patient Information 2013 [employer].  

## 2012-07-31 NOTE — Progress Notes (Signed)
[facility] [address], [place] [address] [phone]  Date:  07/31/2012   Name:  Rhonda Green   DOB:  10-Jun-1971   MRN:  [id number]  PCP:  No primary provider on file.    Chief Complaint: Abdominal Pain   History of Present Illness:  Rhonda Green is a 42 y.o. very pleasant female patient who presents with the following:  6 month history of worsening abdominal pain.  Says initially associated with morning nausea and vomiting that has resolved.  Now has diarrhea followed by cramping abdominal discomfort.  Was not able to leave the house today for five hours due to recurrent diarrhea and unrelenting abdominal cramping pain.  No fever or chills. No further nausea or vomiting.  No blood, mucous or pus in stools.  No recent antibiotic use.  No travel.  Seen in office last week and had negative studies.  Recently found that several relatives on fathers' side had crohn's.  No specific food intolerance or provocative or ameliorating factors.  20 pound weight loss in past 6 months  There is no problem list on file for this patient.   Past Medical History  Diagnosis Date  . Anxiety   . Depression     Past Surgical History  Procedure Date  . Cesarean section   . Abdominal hysterectomy   . Cholecystectomy     History  Substance Use Topics  . Smoking status: Current Every Day Smoker -- 1.0 packs/day    Types: Cigarettes  . Smokeless tobacco: Not on file  . Alcohol Use: No    Family History  Problem Relation Age of Onset  . Diabetes Mother   . Hypertension Mother   . Anuerysm Father     No Known Allergies  Medication list has been reviewed and updated.  Current Outpatient Prescriptions on File Prior to Visit  Medication Sig Dispense Refill  . DULoxetine (CYMBALTA) 60 MG capsule Take 60 mg by mouth daily.      Marland Kitchen omeprazole (PRILOSEC) 20 MG capsule Take 20 mg by mouth daily.        Review of Systems:  As per HPI, otherwise negative.     Physical Examination: Filed Vitals:   07/31/12 1545  BP: 117/74  Pulse: 80  Temp: 98.4 F (36.9 C)  Resp: 18   Filed Vitals:   07/31/12 1545  Weight: 150 lb (68.04 kg)   There is no height on file to calculate BMI. Ideal Body Weight:    GEN: WDWN, NAD, Non-toxic, A & O x 3 HEENT: Atraumatic, Normocephalic. Neck supple. No masses, No LAD. Ears and Nose: No external deformity. CV: RRR, No M/G/R. No JVD. No thrill. No extra heart sounds. PULM: CTA B, no wheezes, crackles, rhonchi. No retractions. No resp. distress. No accessory muscle use. ABD: S, NT, ND, +BS. No rebound. No HSM. EXTR: No c/c/e NEURO Normal gait.  PSYCH: Normally interactive. Conversant. Not depressed or anxious appearing.  Calm demeanor.    Assessment and Plan: Abdominal pain and diarrhea with weight loss Consider crohn's Refer to GI  Carmelina Dane, MD  Results for orders placed in visit on 07/31/12  POCT CBC      Component Value Range   WBC 11.8 (*) 4.6 - 10.2 K/uL   Lymph, poc 2.9  0.6 - 3.4   POC LYMPH PERCENT 24.8  10 - 50 %L   MID (cbc) 0.6  0 - 0.9   POC MID % 5.2  0 - 12 %M   POC Granulocyte 8.3 (*) 2 - 6.9   Granulocyte percent 70.0  37 - 80 %G   RBC 4.70  4.04 - 5.48 M/uL   Hemoglobin 14.2  12.2 - 16.2 g/dL   HCT, POC 16.1  09.6 - 47.9 %   MCV 92.3  80 - 97 fL   MCH, POC 30.2  27 - 31.2 pg   MCHC 32.7  31.8 - 35.4 g/dL   RDW, POC 04.5     Platelet Count, POC 445 (*) 142 - 424 K/uL   MPV 8.5  0 - 99.8 fL  IFOBT (OCCULT BLOOD)      Component Value Range   IFOBT Negative

## 2012-08-01 LAB — C-REACTIVE PROTEIN: CRP: <0.5 mg/dL (ref <0.60)

## 2014-04-30 ENCOUNTER — Encounter: Payer: Self-pay | Admitting: Family Medicine

## 2014-04-30 ENCOUNTER — Ambulatory Visit (INDEPENDENT_AMBULATORY_CARE_PROVIDER_SITE_OTHER): Payer: BC Managed Care – PPO | Admitting: Emergency Medicine

## 2014-04-30 ENCOUNTER — Ambulatory Visit: Payer: BC Managed Care – PPO | Admitting: Family Medicine

## 2014-04-30 VITALS — BP 124/80 | HR 102 | Temp 98.9°F | Resp 16 | Ht 69.0 in | Wt 154.6 lb

## 2014-04-30 DIAGNOSIS — G4710 Hypersomnia, unspecified: Secondary | ICD-10-CM

## 2014-04-30 DIAGNOSIS — R634 Abnormal weight loss: Secondary | ICD-10-CM

## 2014-04-30 DIAGNOSIS — R630 Anorexia: Secondary | ICD-10-CM

## 2014-04-30 DIAGNOSIS — K219 Gastro-esophageal reflux disease without esophagitis: Secondary | ICD-10-CM

## 2014-04-30 DIAGNOSIS — F32A Depression, unspecified: Secondary | ICD-10-CM

## 2014-04-30 DIAGNOSIS — F329 Major depressive disorder, single episode, unspecified: Secondary | ICD-10-CM

## 2014-04-30 DIAGNOSIS — Z1239 Encounter for other screening for malignant neoplasm of breast: Secondary | ICD-10-CM

## 2014-04-30 LAB — TSH: TSH: 1.491 u[IU]/mL (ref 0.350–4.500)

## 2014-04-30 LAB — CBC
HEMATOCRIT: 40.2 % (ref 36.0–46.0)
HEMOGLOBIN: 14 g/dL (ref 12.0–15.0)
MCH: 30.7 pg (ref 26.0–34.0)
MCHC: 34.8 g/dL (ref 30.0–36.0)
MCV: 88.2 fL (ref 78.0–100.0)
Platelets: 388 10*3/uL (ref 150–400)
RBC: 4.56 MIL/uL (ref 3.87–5.11)
RDW: 13.5 % (ref 11.5–15.5)
WBC: 7.8 10*3/uL (ref 4.0–10.5)

## 2014-04-30 MED ORDER — DULOXETINE HCL 60 MG PO CPEP: 60.0000 mg | ORAL_CAPSULE | Freq: Every day | ORAL | Status: DC

## 2014-04-30 NOTE — Patient Instructions (Signed)
We will call you with your lab results and to schedule neurology referral Can take 2 of your current cymbalta dose until you run out, then can get new prescription for 60 mg Add an additional dose of prilosec at bedtime for 1 month Try to add some aerobic exercise to your routine

## 2014-04-30 NOTE — Progress Notes (Signed)
Subjective:    Patient ID: Rhonda Green, female    DOB: 1970-11-01, 43 y.o.   MRN: [id number]  HPI Patient presents today to establish care. She has a long history of migraines which have been well controlled on cymbalta 30 mg. She is seen by Dr. Antonietta Barcelonaonuzi at at [facility]. She was on 90 mg of Cymbalta and been able to gradually decrease her dose to 30 mg with rare migraine.   Depression- the patient has noticed increased fatigue and depression and would like to possibly add something for this. She has never been on any medication for depression.  She separated from husband about a year ago. She feels that she is finally seeing a light at the end of the tunnel as far as her separation is going. Her adult children continue to have difficulty. She sleeps "too much," doesn't want to get up in the mornings. No difficulty going to sleep.  She has been in therapy in the past for many years for issues not related to her separation. She does not wish to pursue therapy at this time. She has a good support system with family and co-workers. She enjoys her job and is not under a great deal of stress. She has anxiety 1-2x per week and is able to meditate and deep breathe until the feelings pass.  Changed cymbalta to night time, didn't notice any difference in somnolence. No HI/SI. Is doing meditation and yoga which she feels has helped her a great deal.   GERD- She has been on OTC omeprazole for many years. She takes it in the morning and doesn't feel that it helps her as much as it used to. She has noticed decreased appetiie. Feels nauseous in the mornings.   She is overdue on her mammogram and PAP. She would not like a flu shot today.   Review of Systems No fever or chills, no chest pain,no SOB, no diarrhea/constipation.     Objective:   Physical Exam  Constitutional: She is oriented to person, place, and time. She appears well-developed and well-nourished.  HENT:  Head: Normocephalic and  atraumatic.  Eyes: Conjunctivae are normal.  Neck: Normal range of motion. Neck supple.  Cardiovascular: Normal rate, regular rhythm and normal heart sounds.   Heart rate 98 on ausculation.   Pulmonary/Chest: Effort normal and breath sounds normal.  Musculoskeletal: Normal range of motion.  Neurological: She is alert and oriented to person, place, and time.  Skin: Skin is warm and dry.  Psychiatric: She has a normal mood and affect. Her behavior is normal. Judgment and thought content normal.  Vitals reviewed.     Assessment & Plan:  Discussed with Dr. Cleta Albertsaub. 1. Gastroesophageal reflux disease, esophagitis presence not specified -She will increase her OTC prilosec to BID for 1 month - Comprehensive metabolic panel  2. Depression - DULoxetine (CYMBALTA) 60 MG capsule; Take 1 capsule (60 mg total) by mouth daily.  Dispense: 30 capsule; Refill: 2 -Will try increased dose of cymbalta, also encouraged patient to increase aerobic activity, continue yoga and meditation. -Encouraged therapy, patient declines at this time.  3. Hypersomnolence - CBC - Comprehensive metabolic panel - She has an upcoming appointment with her neurologist. She will discuss this issue with him.   4. Loss of weight - CBC - Comprehensive metabolic panel - TSH  5. Loss of appetite for more than 2 weeks - Comprehensive metabolic panel  6. Screening for breast cancer - MM Digital Screening; Future  -Follow up in  4-6 weeks for CPE/ follow up of depression.  Emi Belfasteborah B. Jaymi Tinner, FNP-BC  [facility], [facility]  04/30/2014 2:09 PM

## 2014-05-01 LAB — COMPREHENSIVE METABOLIC PANEL
ALBUMIN: 4 g/dL (ref 3.5–5.2)
ALK PHOS: 80 U/L (ref 39–117)
ALT: 19 U/L (ref 0–35)
AST: 20 U/L (ref 0–37)
BUN: 8 mg/dL (ref 6–23)
CO2: 24 mEq/L (ref 19–32)
CREATININE: 0.65 mg/dL (ref 0.50–1.10)
Calcium: 9.2 mg/dL (ref 8.4–10.5)
Chloride: 106 mEq/L (ref 96–112)
GLUCOSE: 61 mg/dL — AB (ref 70–99)
POTASSIUM: 4 meq/L (ref 3.5–5.3)
Sodium: 139 mEq/L (ref 135–145)
Total Bilirubin: 0.5 mg/dL (ref 0.2–1.2)
Total Protein: 6.6 g/dL (ref 6.0–8.3)

## 2014-05-31 ENCOUNTER — Other Ambulatory Visit: Payer: Self-pay | Admitting: Family Medicine

## 2014-05-31 ENCOUNTER — Ambulatory Visit: Payer: Self-pay

## 2014-05-31 DIAGNOSIS — Z1231 Encounter for screening mammogram for malignant neoplasm of breast: Secondary | ICD-10-CM

## 2014-06-25 ENCOUNTER — Ambulatory Visit (INDEPENDENT_AMBULATORY_CARE_PROVIDER_SITE_OTHER): Payer: BC Managed Care – PPO | Admitting: Family Medicine

## 2014-06-25 ENCOUNTER — Encounter: Payer: Self-pay | Admitting: Family Medicine

## 2014-06-25 VITALS — BP 100/60 | HR 110 | Temp 98.5°F | Resp 16 | Ht 69.0 in | Wt 158.6 lb

## 2014-06-25 DIAGNOSIS — R1031 Right lower quadrant pain: Secondary | ICD-10-CM

## 2014-06-25 LAB — POCT CBC
Granulocyte percent: 69.9 %G (ref 37–80)
HEMATOCRIT: 43.2 % (ref 37.7–47.9)
Hemoglobin: 14.2 g/dL (ref 12.2–16.2)
Lymph, poc: 2 (ref 0.6–3.4)
MCH: 30 pg (ref 27–31.2)
MCHC: 32.8 g/dL (ref 31.8–35.4)
MCV: 91.5 fL (ref 80–97)
MID (cbc): 0.7 (ref 0–0.9)
MPV: 7.5 fL (ref 0–99.8)
POC Granulocyte: 6.4 (ref 2–6.9)
POC LYMPH %: 22.1 % (ref 10–50)
POC MID %: 8 %M (ref 0–12)
Platelet Count, POC: 325 10*3/uL (ref 142–424)
RBC: 4.72 M/uL (ref 4.04–5.48)
RDW, POC: 13.6 %
WBC: 9.2 10*3/uL (ref 4.6–10.2)

## 2014-06-25 LAB — POCT UA - MICROSCOPIC ONLY
BACTERIA, U MICROSCOPIC: NEGATIVE
CASTS, UR, LPF, POC: NEGATIVE
CRYSTALS, UR, HPF, POC: NEGATIVE
Mucus, UA: NEGATIVE
RBC, urine, microscopic: NEGATIVE
Yeast, UA: NEGATIVE

## 2014-06-25 LAB — POCT URINALYSIS DIPSTICK
BILIRUBIN UA: NEGATIVE
GLUCOSE UA: NEGATIVE
Ketones, UA: NEGATIVE
Leukocytes, UA: NEGATIVE
Nitrite, UA: NEGATIVE
Protein, UA: NEGATIVE
Spec Grav, UA: 1.02
Urobilinogen, UA: 0.2
pH, UA: 6.5

## 2014-06-25 MED ORDER — MELOXICAM 15 MG PO TABS: 15.0000 mg | ORAL_TABLET | Freq: Every day | ORAL | Status: DC

## 2014-06-25 MED ORDER — CYCLOBENZAPRINE HCL 10 MG PO TABS: ORAL_TABLET | ORAL | Status: DC

## 2014-06-25 MED ORDER — KETOROLAC TROMETHAMINE 60 MG/2ML IM SOLN
60.0000 mg | Freq: Once | INTRAMUSCULAR | Status: AC
  Administered 2014-06-25: 60 mg via INTRAMUSCULAR

## 2014-06-25 NOTE — Progress Notes (Signed)
Subjective:    Patient ID: Rhonda Green, female    DOB: 21-Sep-1970, 43 y.o.   MRN: [id number]  HPI This is a pleasant 43 yo female who presents today with 3 day history of right sided, lower abdominal pain. Feels like tearing of left leg. Yesterday pain was worse- after she moved a great deal. No improvement with ibuprofen, alleve. Heat helps briefly. Difficult to sleep. Appetite normal, no nausea or vomiting. No pain with walking. Can not recall any trauma/injury/heavy lifting.   Has been told in the past that she has ovarian cysts.   Review of Systems No fever/chills, non-mensturating (hysterectomy), no diarrhea, no constipation, no dysuria, no hematuria, no frequency, normal appetite.No vaginal discharge. Last BM today- normal.     Objective:   Physical Exam  Constitutional: She is oriented to person, place, and time. She appears well-developed and well-nourished.  HENT:  Head: Normocephalic and atraumatic.  Eyes: Conjunctivae are normal.  Neck: Normal range of motion. Neck supple.  Cardiovascular: Regular rhythm and normal heart sounds.  Tachycardia present.   Pulmonary/Chest: Effort normal and breath sounds normal.  Abdominal: Soft. Bowel sounds are normal. She exhibits no distension and no mass. There is no hepatosplenomegaly. Tenderness: slight tenderness with deep palpation RLQ. There is no rebound, no guarding and no CVA tenderness. No hernia.  No pain with straight leg raise.    Genitourinary: Pelvic exam was performed with patient supine. Right adnexum displays tenderness. Right adnexum displays no mass and no fullness. Left adnexum displays no mass, no tenderness and no fullness.  Musculoskeletal: Normal range of motion.  Neurological: She is alert and oriented to person, place, and time.  Skin: Skin is warm and dry.  Psychiatric: She has a normal mood and affect. Her behavior is normal. Judgment and thought content normal.  Vitals reviewed.  BP 100/60 mmHg  Pulse 110   Temp(Src) 98.5 F (36.9 C) (Oral)  Resp 16  Ht 5\' 9"  (1.753 m)  Wt 158 lb 9.6 oz (71.94 kg)  BMI 23.41 kg/m2  SpO2 97%  Recheck HR 98.  Results for orders placed or performed in visit on 06/25/14  POCT UA - Microscopic Only  Result Value Ref Range   WBC, Ur, HPF, POC 0-1    RBC, urine, microscopic neg    Bacteria, U Microscopic neg    Mucus, UA neg    Epithelial cells, urine per micros 1-6    Crystals, Ur, HPF, POC neg    Casts, Ur, LPF, POC neg    Yeast, UA neg   POCT urinalysis dipstick  Result Value Ref Range   Color, UA yellow    Clarity, UA hazy    Glucose, UA neg    Bilirubin, UA neg    Ketones, UA neg    Spec Grav, UA 1.020    Blood, UA small    pH, UA 6.5    Protein, UA neg    Urobilinogen, UA 0.2    Nitrite, UA neg    Leukocytes, UA Negative   POCT CBC  Result Value Ref Range   WBC 9.2 4.6 - 10.2 K/uL   Lymph, poc 2.0 0.6 - 3.4   POC LYMPH PERCENT 22.1 10 - 50 %L   MID (cbc) 0.7 0 - 0.9   POC MID % 8.0 0 - 12 %M   POC Granulocyte 6.4 2 - 6.9   Granulocyte percent 69.9 37 - 80 %G   RBC 4.72 4.04 - 5.48 M/uL   Hemoglobin 14.2  12.2 - 16.2 g/dL   HCT, POC 16.143.2 09.637.7 - 47.9 %   MCV 91.5 80 - 97 fL   MCH, POC 30.0 27 - 31.2 pg   MCHC 32.8 31.8 - 35.4 g/dL   RDW, POC 04.513.6 %   Platelet Count, POC 325 142 - 424 K/uL   MPV 7.5 0 - 99.8 fL   Patient given toradol 60 mg IM with improvement of pain.     Assessment & Plan:  Discussed with Dr. Cleta Albertsaub 1. Right lower quadrant abdominal pain - UA and CBC normal- pain possibly due to ruptured ovarian cyst in patient with history of ovarian cysts. - POCT UA - Microscopic Only - POCT urinalysis dipstick - POCT CBC - US Transvaginal Non-OB; Future - ketorolac (TORADOL) injection 60 mg; Inject 2 mLs (60 mg total) into the muscle once. - cyclobenzaprine (FLEXERIL) 10 MG tablet; Take 1/2 - 1 tablet at bedtime as needed for pain/spasm  Dispense: 15 tablet; Refill: 0 - meloxicam (MOBIC) 15 MG tablet; Take 1 tablet (15 mg  total) by mouth daily.  Dispense: 30 tablet; Refill: 0 - RTC if worsening pain, fever, vomiting.  Emi Belfasteborah B. Gessner, FNP-BC  [facility], [facility]  06/26/2014 5:06 PM

## 2014-06-26 ENCOUNTER — Other Ambulatory Visit: Payer: Self-pay | Admitting: Radiology

## 2014-06-26 ENCOUNTER — Telehealth: Payer: Self-pay

## 2014-06-26 DIAGNOSIS — R102 Pelvic and perineal pain: Secondary | ICD-10-CM

## 2014-06-26 MED ORDER — TRAMADOL HCL 50 MG PO TABS: 50.0000 mg | ORAL_TABLET | Freq: Three times a day (TID) | ORAL | Status: DC | PRN

## 2014-06-26 NOTE — Telephone Encounter (Signed)
Spoke with patient who continues to have pain when she is moving. Pain for about 15 minutes- resolves when she is still. No fever, no nausea/no vomiting. Some relief with meloxicam, none with flexeril. Relief with heat. Will fax RX for tramadol. Instructed her to RTC if worsening pain, fever, vomiting.

## 2014-06-26 NOTE — Telephone Encounter (Signed)
Pt of Dr. Leone PayorGessner was seen 12/29 for abdominal pain, pt states she is still in a lot of pain, and would like to know if there is something that can be called in for her. Please advise

## 2014-06-26 NOTE — Telephone Encounter (Signed)
[facility] pharmacy called, and states that they have not yet received this fax. Please resend

## 2014-06-26 NOTE — Telephone Encounter (Signed)
Please advise 

## 2014-06-27 NOTE — Telephone Encounter (Signed)
Called pharmacy- script was not received. Called in VO to the pharmacy Tech. Pt advised.

## 2014-07-08 ENCOUNTER — Other Ambulatory Visit: Payer: Self-pay

## 2014-07-30 ENCOUNTER — Other Ambulatory Visit: Payer: Self-pay | Admitting: Family Medicine

## 2014-10-30 ENCOUNTER — Other Ambulatory Visit: Payer: Self-pay | Admitting: Emergency Medicine

## 2014-10-31 ENCOUNTER — Other Ambulatory Visit: Payer: Self-pay | Admitting: Emergency Medicine

## 2014-12-01 ENCOUNTER — Ambulatory Visit (INDEPENDENT_AMBULATORY_CARE_PROVIDER_SITE_OTHER): Payer: BC Managed Care – PPO | Admitting: Emergency Medicine

## 2014-12-01 VITALS — BP 104/62 | HR 83 | Temp 98.2°F | Resp 18 | Ht 69.0 in | Wt 154.2 lb

## 2014-12-01 DIAGNOSIS — G43809 Other migraine, not intractable, without status migrainosus: Secondary | ICD-10-CM | POA: Diagnosis not present

## 2014-12-01 DIAGNOSIS — F329 Major depressive disorder, single episode, unspecified: Secondary | ICD-10-CM

## 2014-12-01 DIAGNOSIS — F32A Depression, unspecified: Secondary | ICD-10-CM

## 2014-12-01 MED ORDER — DULOXETINE HCL 60 MG PO CPEP: ORAL_CAPSULE | ORAL | Status: DC

## 2014-12-01 MED ORDER — RIZATRIPTAN BENZOATE 10 MG PO TABS: 10.0000 mg | ORAL_TABLET | ORAL | Status: DC | PRN

## 2014-12-01 NOTE — Patient Instructions (Signed)

## 2014-12-01 NOTE — Progress Notes (Signed)
Subjective:    Patient ID: Rhonda Green, female    DOB: February 11, 1971, 44 y.o.   MRN: [id number] This chart was scribed for Rhonda Gobble, MD by [place] Peace, ED Scribe. The patient was seen in [facility]. The patient's care was started at 3:17 PM.  Chief Complaint  Patient presents with  . Medication Refill    cymbalta, maxalt     HPI HPI Comments: [name] is a 44 y.o. female who presents to the [facility] seeking medication refill of Cymbalta  and Maxalt . Pt reports she was prescribed Cymbalta by her neurologist at [facility], in [facility], to address history of migraines. She adds she is currently trying to slowly take herself off of Cymbalta so she has started taking lower dosages as prescribed by her neurologist. Pt is non-smoker.    Past Medical History  Diagnosis Date  . Anxiety   . Depression    Past Surgical History  Procedure Laterality Date  . Cesarean section    . Abdominal hysterectomy    . Cholecystectomy    No Known Allergies Current Outpatient Prescriptions on File Prior to Visit  Medication Sig Dispense Refill  . DULoxetine (CYMBALTA) 60 MG capsule Take 1 capsule (60 mg total) by mouth daily. PATIENT NEEDS OFFICE VISIT FOR ADDITIONAL REFILLS 30 capsule 0  . omeprazole (PRILOSEC) 20 MG capsule Take 20 mg by mouth daily.    . rizatriptan (MAXALT) 10 MG tablet Take 10 mg by mouth as needed for migraine. May repeat in 2 hours if needed    . cyclobenzaprine (FLEXERIL) 10 MG tablet Take 1/2 - 1 tablet at bedtime as needed for pain/spasm (Patient not taking: Reported on 12/01/2014) 15 tablet 0  . meloxicam (MOBIC) 15 MG tablet Take 1 tablet (15 mg total) by mouth daily. (Patient not taking: Reported on 12/01/2014) 30 tablet 0  . traMADol (ULTRAM) 50 MG tablet Take 1 tablet (50 mg total) by mouth every 8 (eight) hours as needed. (Patient not taking: Reported on 12/01/2014) 30 tablet 0   No current facility-administered medications on file prior to visit.      Review of  Systems  Constitutional: Negative for fever and chills.  Gastrointestinal: Negative for nausea and vomiting.       Objective:   Physical Exam  Constitutional: She is oriented to person, place, and time. She appears well-developed and well-nourished. No distress.  HENT:  Head: Normocephalic and atraumatic.  Eyes: Conjunctivae and EOM are normal.  Neck: Neck supple. No tracheal deviation present.  Cardiovascular: Normal rate, regular rhythm and normal heart sounds.  Exam reveals no gallop and no friction rub.   No murmur heard. Pulmonary/Chest: Effort normal and breath sounds normal. No respiratory distress. She has no wheezes. She has no rales.  Musculoskeletal: Normal range of motion.  Neurological: She is alert and oriented to person, place, and time.  Skin: Skin is warm and dry.  Psychiatric: She has a normal mood and affect. Her behavior is normal.  Nursing note and vitals reviewed.    Filed Vitals:   12/01/14 1449  BP: 104/62  Pulse: 83  Temp: 98.2 F (36.8 C)  Resp: 18    3:22 PM- Treatment plan was discussed with patient who verbalizes understanding and agrees.       Assessment & Plan:  I refilled her Cymbalta and Maxalt. She has never had any symptoms of serotonin syndrome and I gave her information about this.I personally performed the services described in this documentation, which was  scribed in my presence. The recorded information has been reviewed and is accurate.  Earl LitesSteve Siyah Mault, MD

## 2015-02-04 ENCOUNTER — Encounter: Payer: Self-pay | Admitting: Physician Assistant

## 2015-02-04 ENCOUNTER — Ambulatory Visit (INDEPENDENT_AMBULATORY_CARE_PROVIDER_SITE_OTHER): Payer: BC Managed Care – PPO | Admitting: Physician Assistant

## 2015-02-04 VITALS — BP 114/78 | HR 94 | Temp 97.8°F | Resp 16 | Ht 69.0 in | Wt 151.4 lb

## 2015-02-04 DIAGNOSIS — R3915 Urgency of urination: Secondary | ICD-10-CM

## 2015-02-04 DIAGNOSIS — N3090 Cystitis, unspecified without hematuria: Secondary | ICD-10-CM

## 2015-02-04 DIAGNOSIS — F419 Anxiety disorder, unspecified: Secondary | ICD-10-CM

## 2015-02-04 LAB — POCT URINALYSIS DIPSTICK
Bilirubin, UA: NEGATIVE
Glucose, UA: NEGATIVE
Ketones, UA: NEGATIVE
Nitrite, UA: POSITIVE
Protein, UA: NEGATIVE
Spec Grav, UA: 1.02
Urobilinogen, UA: 0.2
pH, UA: 7

## 2015-02-04 LAB — POCT UA - MICROSCOPIC ONLY
CASTS, UR, LPF, POC: NEGATIVE
Crystals, Ur, HPF, POC: NEGATIVE
MUCUS UA: NEGATIVE
YEAST UA: NEGATIVE

## 2015-02-04 MED ORDER — CITALOPRAM HYDROBROMIDE 20 MG PO TABS: 20.0000 mg | ORAL_TABLET | Freq: Every day | ORAL | Status: DC

## 2015-02-04 MED ORDER — NITROFURANTOIN MONOHYD MACRO 100 MG PO CAPS: 100.0000 mg | ORAL_CAPSULE | Freq: Two times a day (BID) | ORAL | Status: AC

## 2015-02-04 NOTE — Progress Notes (Signed)
[facility] [address] [place] [address] [phone]  Date:  02/04/2015   Name:  Rhonda Green   DOB:  11/08/1970   MRN:  [id number]  PCP:  No PCP Per Patient    Chief Complaint: Urinary Urgency and Medication Refill   History of Present Illness:  This is a 44 y.o. female with PMH anxiety and depression who is presenting with urinary frequency and urgency x 1 year. States sometimes she has to urinate every 10 minutes. Intermittent dysuria present. Feels suprapubic and pelvic pressure. States sometimes feels like her bladder is "falling out". Denies vaginal discharge, hematuria, fever, chills, n/v, back pain, incontinence. Sexually active, no new partners. Urinates after sex and wipes front to back. 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 No longer followed by GYN/onc. Last UTI 4-5 years ago and felt different -- more dysuria. She denies tob use.  Pt wanting to change her anti-depressant. She is currently taking cymbalta but feels it is not working anymore. She has problems with general anxiety. Does not have panic attacks. States when cymbalta was started 5 years ago she was having more situational depression/anxiety. Mood better now, now more problems with anxiety. Denies SI/HI. No problems with sleep.  Review of Systems:  Review of Systems See HPI  There are no active problems to display for this patient.   Prior to Admission medications   Medication Sig Start Date End Date Taking? Authorizing Provider         DULoxetine (CYMBALTA) 60 MG capsule Take 1 tablet daily 12/01/14  Yes Collene Gobble, MD         omeprazole (PRILOSEC) 20 MG capsule Take 20 mg by mouth daily.   Yes Historical Provider, MD  rizatriptan (MAXALT) 10 MG tablet Take 1 tablet (10 mg total) by mouth as needed for migraine. May repeat in 2 hours if needed 12/01/14  Yes Collene Gobble, MD           No Known Allergies  Past Surgical History  Procedure Laterality Date  .  Cesarean section    . Abdominal hysterectomy    . Cholecystectomy      History  Substance Use Topics  . Smoking status: Never Smoker   . Smokeless tobacco: Never Used  . Alcohol Use: No    Family History  Problem Relation Age of Onset  . Diabetes Mother   . Hypertension Mother   . Anuerysm Father     Medication list has been reviewed and updated.  Physical Examination:  Physical Exam  Constitutional: She is oriented to person, place, and time. She appears well-developed and well-nourished. No distress.  Pt smells of cigarette smoke  HENT:  Head: Normocephalic and atraumatic.  Right Ear: Hearing normal.  Left Ear: Hearing normal.  Nose: Nose normal.  Eyes: Conjunctivae and lids are normal. Right eye exhibits no discharge. Left eye exhibits no discharge. No scleral icterus.  Cardiovascular: Normal rate, regular rhythm, normal heart sounds and normal pulses.   No murmur heard. Pulmonary/Chest: Effort normal and breath sounds normal. No respiratory distress. She has no wheezes. She has no rhonchi. She has no rales.  Abdominal: Soft. Normal appearance. There is tenderness in the suprapubic area. There is no CVA tenderness.  Musculoskeletal: Normal range of motion.  Lymphadenopathy:    She has no cervical adenopathy.  Neurological: She is alert and oriented to person, place, and time.  Skin: Skin is warm, dry and intact. No  lesion and no rash noted.  Psychiatric: She has a normal mood and affect. Her speech is normal and behavior is normal. Thought content normal.    BP 114/78 mmHg  Pulse 94  Temp(Src) 97.8 F (36.6 C) (Oral)  Resp 16  Ht  (1.753 m)  Wt 151 lb 6.4 oz (68.675 kg)  BMI 22.35 kg/m2  SpO2 98%  Results for orders placed or performed in visit on 02/04/15  POCT urinalysis dipstick  Result Value Ref Range   Color, UA Amber    Clarity, UA Turbid    Glucose, UA Negative    Bilirubin, UA Negative    Ketones, UA Negative    Spec Grav, UA 1.020     Blood, UA Small    pH, UA 7.0    Protein, UA Negative    Urobilinogen, UA 0.2    Nitrite, UA Positive    Leukocytes, UA moderate (2+) (A) Negative  POCT UA - Microscopic Only  Result Value Ref Range   WBC, Ur, HPF, POC 7-9    RBC, urine, microscopic 2-5    Bacteria, U Microscopic 2+    Mucus, UA Negative    Epithelial cells, urine per micros 2-5    Crystals, Ur, HPF, POC Negative    Casts, Ur, LPF, POC Negative    Yeast, UA Negative     Assessment and Plan:  1. Cystitis 2. Urgency of urination UA suggestive of UTI although not terrible considering her symptoms started 1 year ago. Urine culture pending. Macrobid BID prescribed. She will follow up in 6 weeks for anxiety. Will repeat UA at that time. If still having hematuria, will send to urology. Pt denies tob use but strong odor on her clothes. If sx continue will also do pelvic exam to rule out cystocele or other anatomic cause. - Urine culture - nitrofurantoin, macrocrystal-monohydrate, (MACROBID) 100 MG capsule; Take 1 capsule (100 mg total) by mouth 2 (two) times daily.  Dispense: 14 capsule; Refill: 0 - POCT urinalysis dipstick - POCT UA - Microscopic Only  3. Anxiety She will taper cymbalta over next 2-4 weeks, discussed taper method. At week 2 will start celexa 1/2 tab x 1 week then full tab thereafter. Follow up in 6 weeks. - citalopram (CELEXA) 20 MG tablet; Take 1 tablet (20 mg total) by mouth daily.  Dispense: 30 tablet; Refill: 3   Rhonda Green V. Dyke Brackett, MHS [facility]  02/05/2015

## 2015-02-04 NOTE — Patient Instructions (Signed)
Take half dose cymbalta daily for 1 week. Then cut that dose in half and take for another 1 week. Start celexa half tab daily at the start of week 3. At week 4 take full dose of celexa daily. May continue to cut cymbalta in half for up to 4 weeks. Stop completely by start of week 3 - end of week 4. Take antibiotic twice a day for 7 days. I will call you with results of urine culture. If your symptoms are not improving after antibiotics finished, return for further testing

## 2015-02-05 DIAGNOSIS — F411 Generalized anxiety disorder: Secondary | ICD-10-CM | POA: Insufficient documentation

## 2015-02-05 DIAGNOSIS — F419 Anxiety disorder, unspecified: Secondary | ICD-10-CM | POA: Insufficient documentation

## 2015-02-06 LAB — URINE CULTURE

## 2015-04-07 ENCOUNTER — Ambulatory Visit (INDEPENDENT_AMBULATORY_CARE_PROVIDER_SITE_OTHER): Payer: BC Managed Care – PPO | Admitting: Emergency Medicine

## 2015-04-07 VITALS — BP 118/72 | HR 89 | Temp 98.2°F | Resp 18 | Ht 69.0 in | Wt 148.0 lb

## 2015-04-07 DIAGNOSIS — K219 Gastro-esophageal reflux disease without esophagitis: Secondary | ICD-10-CM

## 2015-04-07 DIAGNOSIS — R197 Diarrhea, unspecified: Secondary | ICD-10-CM | POA: Diagnosis not present

## 2015-04-07 DIAGNOSIS — F329 Major depressive disorder, single episode, unspecified: Secondary | ICD-10-CM | POA: Diagnosis not present

## 2015-04-07 DIAGNOSIS — F32A Depression, unspecified: Secondary | ICD-10-CM

## 2015-04-07 LAB — POCT CBC
Granulocyte percent: 70.5 %G (ref 37–80)
HCT, POC: 43.4 % (ref 37.7–47.9)
Hemoglobin: 14.8 g/dL (ref 12.2–16.2)
Lymph, poc: 1.9 (ref 0.6–3.4)
MCH, POC: 30.4 pg (ref 27–31.2)
MCHC: 34.2 g/dL (ref 31.8–35.4)
MCV: 88.8 fL (ref 80–97)
MID (cbc): 0.6 (ref 0–0.9)
MPV: 7.9 fL (ref 0–99.8)
PLATELET COUNT, POC: 304 10*3/uL (ref 142–424)
POC Granulocyte: 5.9 (ref 2–6.9)
POC LYMPH %: 22.4 % (ref 10–50)
POC MID %: 7.1 % (ref 0–12)
RBC: 4.88 M/uL (ref 4.04–5.48)
RDW, POC: 12.8 %
WBC: 8.4 10*3/uL (ref 4.6–10.2)

## 2015-04-07 LAB — COMPLETE METABOLIC PANEL WITH GFR
ALT: 13 U/L (ref 6–29)
AST: 15 U/L (ref 10–30)
Albumin: 4.3 g/dL (ref 3.6–5.1)
Alkaline Phosphatase: 67 U/L (ref 33–115)
BUN: 9 mg/dL (ref 7–25)
CHLORIDE: 103 mmol/L (ref 98–110)
CO2: 27 mmol/L (ref 20–31)
Calcium: 9.8 mg/dL (ref 8.6–10.2)
Creat: 0.68 mg/dL (ref 0.50–1.10)
Glucose, Bld: 96 mg/dL (ref 65–99)
POTASSIUM: 4.2 mmol/L (ref 3.5–5.3)
Sodium: 139 mmol/L (ref 135–146)
Total Bilirubin: 0.5 mg/dL (ref 0.2–1.2)
Total Protein: 6.8 g/dL (ref 6.1–8.1)

## 2015-04-07 MED ORDER — CHOLESTYRAMINE 4 G PO PACK: PACK | ORAL | Status: DC

## 2015-04-07 NOTE — Progress Notes (Signed)
Subjective:  This chart was scribed for Rhonda Lites MD, by [name], at [facility].  This patient was seen in room 1 and the patient's care was started at 10:35 AM.    Patient ID: Rhonda Green, female    DOB: 14-Aug-1970, 44 y.o.   MRN: [id number] Chief Complaint  Patient presents with  . Diarrhea    x2 weeks every morning  . Nausea    HPI  HPI Comments: Rhonda Green is a 44 y.o. female who [facility] complaining of waking up with intermittent diarrhea (with mucous) and abdominal pain, nausea chills/sweating onset three weeks ago.  She states that she was not able to get out of the bed this whole weekend.  Patient stopped taking Cymbalta two months ago.  She is currently only taking Omeprazole everyday (has been taking it for years) and Maxalt once in a while.  Patient has had her gall bladder removed 4-5 years ago, has had a c-section as well as an hysterectomy. She has not been out of the country recently and no one else at home has similar symptoms.  Patients grandmother and fathers family had a history of GI issues.  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    Patient Active Problem List   Diagnosis Date Noted  . Anxiety 02/05/2015   Past Medical History  Diagnosis Date  . Anxiety   . Depression   . GERD (gastroesophageal reflux disease)    Past Surgical History  Procedure Laterality Date  . Cesarean section    . Abdominal hysterectomy    . Cholecystectomy     No Known Allergies Prior to Admission medications   Medication Sig Start Date End Date Taking? Authorizing Provider  omeprazole (PRILOSEC) 20 MG capsule Take 20 mg by mouth daily.   Yes Historical Provider, MD  rizatriptan (MAXALT) 10 MG tablet Take 1 tablet (10 mg total) by mouth as needed for migraine. May repeat in 2 hours if needed 12/01/14  Yes Collene Gobble, MD  citalopram (CELEXA) 20 MG tablet Take 1 tablet (20 mg total) by mouth  daily. Patient not taking: Reported on 04/07/2015 02/04/15   Dorna Leitz, PA-C  cyclobenzaprine (FLEXERIL) 10 MG tablet Take 1/2 - 1 tablet at bedtime as needed for pain/spasm Patient not taking: Reported on 04/07/2015 06/25/14   Emi Belfast, FNP  DULoxetine (CYMBALTA) 60 MG capsule Take 1 tablet daily Patient not taking: Reported on 04/07/2015 12/01/14   Collene Gobble, MD  meloxicam (MOBIC) 15 MG tablet Take 1 tablet (15 mg total) by mouth daily. Patient not taking: Reported on 04/07/2015 06/25/14   Emi Belfast, FNP   Social History   Social History  . Marital Status: Legally Separated    Spouse Name: N/A  . Number of Children: N/A  . Years of Education: N/A   Occupational History  . Not on file.   Social History Main Topics  . Smoking status: Never Smoker   . Smokeless tobacco: Never Used  . Alcohol Use: No  . Drug Use: No  . Sexual Activity: No   Other Topics Concern  . Not on file   Social History Narrative        Review of Systems  Constitutional: Positive for chills and diaphoresis.  Eyes: Negative for pain, redness and itching.  Respiratory: Negative for cough and shortness of breath.   Gastrointestinal: Positive for nausea, abdominal pain and diarrhea. Negative  for vomiting.  Musculoskeletal: Negative for neck pain and neck stiffness.       Objective:   Physical Exam Filed Vitals:   04/07/15 0929  BP: 118/72  Pulse: 89  Temp: 98.2 F (36.8 C)  TempSrc: Oral  Resp: 18  Height:  (1.753 m)  Weight: 148 lb (67.132 kg)  SpO2: 98%   CONSTITUTIONAL: Well developed/well nourished HEAD: Normocephalic/atraumatic EYES: EOMI/PERRL ENMT: Mucous membranes moist NECK: supple no meningeal signs SPINE/BACK:entire spine nontender CV: S1/S2 noted, no murmurs/rubs/gallops noted LUNGS: Lungs are clear to auscultation bilaterally, no apparent distress ABDOMEN: soft, nontender, no rebound or guarding, bowel sounds noted throughout abdomen GU:no cva  tenderness NEURO: Pt is awake/alert/appropriate, moves all extremitiesx4.  No facial droop.   EXTREMITIES: pulses normal/equal, full ROM SKIN: warm, color normal PSYCH: no abnormalities of mood noted, alert and oriented to situation  Results for orders placed or performed in visit on 04/07/15  POCT CBC  Result Value Ref Range   WBC 8.4 4.6 - 10.2 K/uL   Lymph, poc 1.9 0.6 - 3.4   POC LYMPH PERCENT 22.4 10 - 50 %L   MID (cbc) 0.6 0 - 0.9   POC MID % 7.1 0 - 12 %M   POC Granulocyte 5.9 2 - 6.9   Granulocyte percent 70.5 37 - 80 %G   RBC 4.88 4.04 - 5.48 M/uL   Hemoglobin 14.8 12.2 - 16.2 g/dL   HCT, POC 16.1 09.6 - 47.9 %   MCV 88.8 80 - 97 fL   MCH, POC 30.4 27 - 31.2 pg   MCHC 34.2 31.8 - 35.4 g/dL   RDW, POC 04.5 %   Platelet Count, POC 304 142 - 424 K/uL   MPV 7.9 0 - 99.8 fL        Assessment & Plan:  Routine labs done.  Will try questran 1 packet per day.  Follow up after labs have returned. I personally performed the services described in this documentation, which was scribed in my presence. The recorded information has been reviewed and is accurate.

## 2015-04-08 ENCOUNTER — Telehealth: Payer: Self-pay

## 2015-04-08 ENCOUNTER — Encounter (HOSPITAL_COMMUNITY): Payer: Self-pay | Admitting: Emergency Medicine

## 2015-04-08 ENCOUNTER — Telehealth: Payer: Self-pay | Admitting: Gastroenterology

## 2015-04-08 ENCOUNTER — Emergency Department (HOSPITAL_COMMUNITY)
Admission: EM | Admit: 2015-04-08 | Discharge: 2015-04-08 | Disposition: A | Discharge status: Home / Self Care | Payer: BC Managed Care – PPO | Attending: Emergency Medicine | Admitting: Emergency Medicine

## 2015-04-08 DIAGNOSIS — Z8659 Personal history of other mental and behavioral disorders: Secondary | ICD-10-CM | POA: Insufficient documentation

## 2015-04-08 DIAGNOSIS — R112 Nausea with vomiting, unspecified: Secondary | ICD-10-CM | POA: Insufficient documentation

## 2015-04-08 DIAGNOSIS — R109 Unspecified abdominal pain: Secondary | ICD-10-CM | POA: Insufficient documentation

## 2015-04-08 DIAGNOSIS — Z79899 Other long term (current) drug therapy: Secondary | ICD-10-CM | POA: Insufficient documentation

## 2015-04-08 DIAGNOSIS — K219 Gastro-esophageal reflux disease without esophagitis: Secondary | ICD-10-CM | POA: Insufficient documentation

## 2015-04-08 DIAGNOSIS — R197 Diarrhea, unspecified: Secondary | ICD-10-CM | POA: Insufficient documentation

## 2015-04-08 LAB — URINALYSIS, ROUTINE W REFLEX MICROSCOPIC
Bilirubin Urine: NEGATIVE
Glucose, UA: NEGATIVE mg/dL
Ketones, ur: NEGATIVE mg/dL
LEUKOCYTES UA: NEGATIVE
Nitrite: NEGATIVE
Protein, ur: NEGATIVE mg/dL
SPECIFIC GRAVITY, URINE: 1.007 (ref 1.005–1.030)
UROBILINOGEN UA: 0.2 mg/dL (ref 0.0–1.0)
pH: 7 (ref 5.0–8.0)

## 2015-04-08 LAB — SEDIMENTATION RATE: SED RATE: 4 mm/h (ref 0–20)

## 2015-04-08 LAB — COMPREHENSIVE METABOLIC PANEL
ALBUMIN: 3.8 g/dL (ref 3.5–5.0)
ALT: 15 U/L (ref 14–54)
ANION GAP: 9 (ref 5–15)
AST: 21 U/L (ref 15–41)
Alkaline Phosphatase: 66 U/L (ref 38–126)
BILIRUBIN TOTAL: 0.7 mg/dL (ref 0.3–1.2)
BUN: 6 mg/dL (ref 6–20)
CO2: 25 mmol/L (ref 22–32)
Calcium: 9.1 mg/dL (ref 8.9–10.3)
Chloride: 102 mmol/L (ref 101–111)
Creatinine, Ser: 0.83 mg/dL (ref 0.44–1.00)
GFR calc Af Amer: >60 mL/min (ref >60)
GFR calc non Af Amer: >60 mL/min (ref >60)
GLUCOSE: 117 mg/dL — AB (ref 65–99)
POTASSIUM: 3.6 mmol/L (ref 3.5–5.1)
SODIUM: 136 mmol/L (ref 135–145)
Total Protein: 6.7 g/dL (ref 6.5–8.1)

## 2015-04-08 LAB — URINE MICROSCOPIC-ADD ON

## 2015-04-08 LAB — I-STAT BETA HCG BLOOD, ED (MC, WL, AP ONLY)

## 2015-04-08 LAB — CBC
HCT: 41.4 % (ref 36.0–46.0)
HEMOGLOBIN: 13.9 g/dL (ref 12.0–15.0)
MCH: 30.2 pg (ref 26.0–34.0)
MCHC: 33.6 g/dL (ref 30.0–36.0)
MCV: 90 fL (ref 78.0–100.0)
Platelets: 280 10*3/uL (ref 150–400)
RBC: 4.6 MIL/uL (ref 3.87–5.11)
RDW: 12.6 % (ref 11.5–15.5)
WBC: 7.7 10*3/uL (ref 4.0–10.5)

## 2015-04-08 LAB — GLIA (IGA/G) + TTG IGA
GLIADIN IGA: 3 U (ref <20)
GLIADIN IGG: 6 U (ref <20)
Tissue Transglutaminase Ab, IgA: 1 U/mL (ref <4)

## 2015-04-08 LAB — LIPASE, BLOOD: Lipase: 17 U/L — ABNORMAL LOW (ref 22–51)

## 2015-04-08 MED ORDER — SODIUM CHLORIDE 0.9 % IV BOLUS (SEPSIS)
1000.0000 mL | Freq: Once | INTRAVENOUS | Status: AC
  Administered 2015-04-08: 1000 mL via INTRAVENOUS

## 2015-04-08 MED ORDER — ONDANSETRON 4 MG PO TBDP: ORAL_TABLET | ORAL | Status: DC

## 2015-04-08 MED ORDER — ONDANSETRON HCL 4 MG/2ML IJ SOLN
4.0000 mg | Freq: Once | INTRAMUSCULAR | Status: AC
  Administered 2015-04-08: 4 mg via INTRAVENOUS
  Filled 2015-04-08: qty 2

## 2015-04-08 NOTE — Discharge Instructions (Signed)

## 2015-04-08 NOTE — ED Notes (Signed)
Onset few months and for the past 3 weeks increase general abdominal pain, diarrhea, and emesis. Pain currently 5/10 general abdominal cramping achy.

## 2015-04-08 NOTE — Telephone Encounter (Signed)
Left message on machine to call back  

## 2015-04-08 NOTE — Telephone Encounter (Signed)
ondansetron (ZOFRAN ODT) 4 MG disintegrating tablet [[id number]]     Order Details

## 2015-04-08 NOTE — ED Provider Notes (Signed)
CSN: [id number]     Arrival date & time 04/08/15  1027 History   First MD Initiated Contact with Patient 04/08/15 1036     Chief Complaint  Patient presents with  . Diarrhea  . Emesis  . Abdominal Pain     (Consider location/radiation/quality/duration/timing/severity/associated sxs/prior Treatment) Patient is a 44 y.o. female presenting with vomiting.  Emesis Severity:  Moderate Duration:  1 day Timing:  Intermittent Quality:  Stomach contents Able to tolerate:  Liquids Progression:  Unchanged Chronicity:  New Recent urination:  Normal Relieved by:  Nothing Worsened by:  Nothing tried Ineffective treatments:  None tried Associated symptoms: diarrhea   Associated symptoms: no abdominal pain and no fever     Past Medical History  Diagnosis Date  . Anxiety   . Depression   . GERD (gastroesophageal reflux disease)    Past Surgical History  Procedure Laterality Date  . Cesarean section    . Abdominal hysterectomy    . Cholecystectomy     Family History  Problem Relation Age of Onset  . Diabetes Mother   . Hypertension Mother   . Anuerysm Father    Social History  Substance Use Topics  . Smoking status: Never Smoker   . Smokeless tobacco: Never Used  . Alcohol Use: No   OB History    No data available     Review of Systems  Gastrointestinal: Positive for vomiting and diarrhea. Negative for abdominal pain.  All other systems reviewed and are negative.     Allergies  Review of patient's allergies indicates no known allergies.  Home Medications   Prior to Admission medications   Medication Sig Start Date End Date Taking? Authorizing Provider  dimenhyDRINATE (DRAMAMINE) 50 MG tablet Take 50 mg by mouth every 8 (eight) hours as needed for nausea.   Yes Historical Provider, MD  omeprazole (PRILOSEC) 20 MG capsule Take 20 mg by mouth daily.   Yes Historical Provider, MD  rizatriptan (MAXALT) 10 MG tablet Take 1 tablet (10 mg total) by mouth as needed for  migraine. May repeat in 2 hours if needed 12/01/14  Yes Collene Gobble, MD  cyclobenzaprine (FLEXERIL) 10 MG tablet Take 1/2 - 1 tablet at bedtime as needed for pain/spasm Patient not taking: Reported on 04/07/2015 06/25/14   Emi Belfast, FNP  DULoxetine (CYMBALTA) 60 MG capsule Take 1 tablet daily Patient not taking: Reported on 04/07/2015 12/01/14   Collene Gobble, MD  meloxicam (MOBIC) 15 MG tablet Take 1 tablet (15 mg total) by mouth daily. Patient not taking: Reported on 04/07/2015 06/25/14   Emi Belfast, FNP  ondansetron (ZOFRAN ODT) 4 MG disintegrating tablet  ODT q4 hours prn nausea/vomit 04/08/15   Mirian Mo, MD   BP 128/75 mmHg  Pulse 85  Temp(Src) 98.1 F (36.7 C) (Oral)  Resp 16  Ht  (1.753 m)  Wt 150 lb (68.04 kg)  BMI 22.14 kg/m2  SpO2 99% Physical Exam  Constitutional: She is oriented to person, place, and time. She appears well-developed and well-nourished.  HENT:  Head: Normocephalic and atraumatic.  Right Ear: External ear normal.  Left Ear: External ear normal.  Eyes: Conjunctivae and EOM are normal. Pupils are equal, round, and reactive to light.  Neck: Normal range of motion. Neck supple.  Cardiovascular: Normal rate, regular rhythm, normal heart sounds and intact distal pulses.   Pulmonary/Chest: Effort normal and breath sounds normal.  Abdominal: Soft. Bowel sounds are normal. There is no tenderness.  Musculoskeletal: Normal  range of motion.  Neurological: She is alert and oriented to person, place, and time.  Skin: Skin is warm and dry.  Vitals reviewed.   ED Course  Procedures (including critical care time) Labs Review Labs Reviewed  LIPASE, BLOOD - Abnormal; Notable for the following:    Lipase 17 (*)    All other components within normal limits  COMPREHENSIVE METABOLIC PANEL - Abnormal; Notable for the following:    Glucose, Bld 117 (*)    All other components within normal limits  URINALYSIS, ROUTINE W REFLEX MICROSCOPIC (NOT  AT [facility]) - Abnormal; Notable for the following:    Hgb urine dipstick TRACE (*)    All other components within normal limits  CBC  URINE MICROSCOPIC-ADD ON  I-STAT BETA HCG BLOOD, ED ([facility], [facility], [facility] ONLY)    Imaging Review No results found. I have personally reviewed and evaluated these images and lab results as part of my medical decision-making.   EKG Interpretation None      MDM   Final diagnoses:  Non-intractable vomiting with nausea, vomiting of unspecified type    44 y.o. female with pertinent PMH of anxiety presents with months of crampy abd pain and diarrhea, however immediate precipitant was vomiting beginning today.  Physical exam benign today.     Wu as above unremarkable.  Doubt SBO as pt is having normal BM now.  Symptoms relieved with zofran.  DC home to fu with GI.   I have reviewed all laboratory and imaging studies if ordered as above  1. Non-intractable vomiting with nausea, vomiting of unspecified type         Mirian Mo, MD 04/08/15 1321

## 2015-04-08 NOTE — Telephone Encounter (Signed)
Agree with Zofran.

## 2015-04-08 NOTE — Telephone Encounter (Signed)
Pt states she was give an rx to stop Diarrhea but now she is vomitting

## 2015-04-09 NOTE — Telephone Encounter (Signed)
Pt said she is returning Patty's call

## 2015-04-10 NOTE — Telephone Encounter (Signed)
Left message on machine to call back I will wait for the pt to return call 

## 2015-04-11 NOTE — Telephone Encounter (Signed)
Pt went to the ED to be seen.

## 2015-04-14 ENCOUNTER — Telehealth: Payer: Self-pay | Admitting: Family Medicine

## 2015-04-14 NOTE — Telephone Encounter (Signed)
Per Dr. Cleta Albertsaub call patient to check status. If she is still not well and/or still having sxs have her still collect Gastrointestinal Panel PCR (order placed 04/07/15). Also she needs to make sure she follows up with Dr. Christella HartiganJacobs  Left message to return call.

## 2015-05-04 ENCOUNTER — Emergency Department (HOSPITAL_COMMUNITY): Payer: BC Managed Care – PPO

## 2015-05-04 ENCOUNTER — Emergency Department (HOSPITAL_COMMUNITY)
Admission: EM | Admit: 2015-05-04 | Discharge: 2015-05-04 | Disposition: A | Discharge status: Home / Self Care | Payer: BC Managed Care – PPO | Attending: Emergency Medicine | Admitting: Emergency Medicine

## 2015-05-04 ENCOUNTER — Encounter (HOSPITAL_COMMUNITY): Payer: Self-pay | Admitting: *Deleted

## 2015-05-04 DIAGNOSIS — R109 Unspecified abdominal pain: Secondary | ICD-10-CM | POA: Insufficient documentation

## 2015-05-04 DIAGNOSIS — R197 Diarrhea, unspecified: Secondary | ICD-10-CM | POA: Diagnosis not present

## 2015-05-04 DIAGNOSIS — F419 Anxiety disorder, unspecified: Secondary | ICD-10-CM | POA: Insufficient documentation

## 2015-05-04 DIAGNOSIS — K219 Gastro-esophageal reflux disease without esophagitis: Secondary | ICD-10-CM | POA: Diagnosis not present

## 2015-05-04 DIAGNOSIS — F329 Major depressive disorder, single episode, unspecified: Secondary | ICD-10-CM | POA: Diagnosis not present

## 2015-05-04 DIAGNOSIS — R6883 Chills (without fever): Secondary | ICD-10-CM | POA: Diagnosis not present

## 2015-05-04 DIAGNOSIS — R112 Nausea with vomiting, unspecified: Secondary | ICD-10-CM | POA: Diagnosis present

## 2015-05-04 DIAGNOSIS — Z79899 Other long term (current) drug therapy: Secondary | ICD-10-CM | POA: Diagnosis not present

## 2015-05-04 LAB — CBC WITH DIFFERENTIAL/PLATELET
Basophils Absolute: 0 10*3/uL (ref 0.0–0.1)
Basophils Relative: 0 %
EOS ABS: 0.2 10*3/uL (ref 0.0–0.7)
Eosinophils Relative: 3 %
HEMATOCRIT: 41.1 % (ref 36.0–46.0)
HEMOGLOBIN: 14.1 g/dL (ref 12.0–15.0)
LYMPHS ABS: 2.4 10*3/uL (ref 0.7–4.0)
LYMPHS PCT: 32 %
MCH: 30.3 pg (ref 26.0–34.0)
MCHC: 34.3 g/dL (ref 30.0–36.0)
MCV: 88.2 fL (ref 78.0–100.0)
MONOS PCT: 10 %
Monocytes Absolute: 0.8 10*3/uL (ref 0.1–1.0)
NEUTROS ABS: 4.1 10*3/uL (ref 1.7–7.7)
NEUTROS PCT: 55 %
Platelets: 302 10*3/uL (ref 150–400)
RBC: 4.66 MIL/uL (ref 3.87–5.11)
RDW: 12.5 % (ref 11.5–15.5)
WBC: 7.5 10*3/uL (ref 4.0–10.5)

## 2015-05-04 LAB — COMPREHENSIVE METABOLIC PANEL
ALT: 18 U/L (ref 14–54)
AST: 19 U/L (ref 15–41)
Albumin: 4 g/dL (ref 3.5–5.0)
Alkaline Phosphatase: 62 U/L (ref 38–126)
Anion gap: 14 (ref 5–15)
BUN: 9 mg/dL (ref 6–20)
CHLORIDE: 103 mmol/L (ref 101–111)
CO2: 22 mmol/L (ref 22–32)
Calcium: 9.5 mg/dL (ref 8.9–10.3)
Creatinine, Ser: 0.78 mg/dL (ref 0.44–1.00)
GFR calc non Af Amer: >60 mL/min (ref >60)
Glucose, Bld: 91 mg/dL (ref 65–99)
POTASSIUM: 3.2 mmol/L — AB (ref 3.5–5.1)
SODIUM: 139 mmol/L (ref 135–145)
Total Bilirubin: 0.9 mg/dL (ref 0.3–1.2)
Total Protein: 6.4 g/dL — ABNORMAL LOW (ref 6.5–8.1)

## 2015-05-04 MED ORDER — SODIUM CHLORIDE 0.9 % IV BOLUS (SEPSIS)
1000.0000 mL | Freq: Once | INTRAVENOUS | Status: AC
  Administered 2015-05-04: 1000 mL via INTRAVENOUS

## 2015-05-04 MED ORDER — PROMETHAZINE HCL 25 MG/ML IJ SOLN
12.5000 mg | Freq: Once | INTRAMUSCULAR | Status: AC
  Administered 2015-05-04: 12.5 mg via INTRAVENOUS
  Filled 2015-05-04: qty 1

## 2015-05-04 MED ORDER — PROMETHAZINE HCL 25 MG PO TABS: 25.0000 mg | ORAL_TABLET | Freq: Four times a day (QID) | ORAL | Status: DC | PRN

## 2015-05-04 MED ORDER — IOHEXOL 300 MG/ML  SOLN
100.0000 mL | Freq: Once | INTRAMUSCULAR | Status: AC | PRN
  Administered 2015-05-04: 100 mL via INTRAVENOUS

## 2015-05-04 MED ORDER — ONDANSETRON HCL 4 MG/2ML IJ SOLN
4.0000 mg | Freq: Once | INTRAMUSCULAR | Status: AC
  Administered 2015-05-04: 4 mg via INTRAVENOUS
  Filled 2015-05-04: qty 2

## 2015-05-04 NOTE — ED Notes (Signed)
Pt to CT

## 2015-05-04 NOTE — ED Provider Notes (Signed)
CSN: [id number]     Arrival date & time 05/04/15  0129 History  By signing my name below, I, Doreatha MartinEva Mathews, attest that this documentation has been prepared under the direction and in the presence of Geoffery Lyonsouglas Sofia Vanmeter, MD. Electronically Signed: Doreatha MartinEva Mathews, ED Scribe. 05/04/2015. 3:12 AM.    Chief Complaint  Patient presents with  . Emesis   Patient is a 44 y.o. female presenting with vomiting. The history is provided by the patient. No language interpreter was used.  Emesis Severity:  Moderate Duration:  3 weeks Timing:  Intermittent Able to tolerate:  Liquids and solids Progression:  Unchanged Chronicity:  Recurrent Recent urination:  Normal Relieved by:  Nothing Ineffective treatments:  Antiemetics Associated symptoms: abdominal pain, chills and diarrhea   Associated symptoms: no fever   Risk factors: prior abdominal surgery   Risk factors: no sick contacts     HPI Comments: Rhonda Green is a 44 y.o. female who presents to the Emergency Department complaining of moderate daily emesis onset 3 weeks ago with associated daily diarrhea onset 3 months ago, nausea, night sweats, chills, LUQ abdominal pain. Pt states that she wakes up every morning feeling nauseated and vomits. Pt was seen in the ED on 10/11 for the same pain. Pt has been seen by her PCP and has a colonoscopy scheduled in 2 weeks. She states no relief with nausea medication. No Hx of similar symptoms. H/o hysterectomy, 2 C-sections, cholecystectomy. No sick contact. Otherwise healthy. No other daily medications. Pt discontinued Cymbalta 3 months ago over the course of a month. She denies fever, bloody stools, difficulty urinating, dysuria.   Past Medical History  Diagnosis Date  . Anxiety   . Depression   . GERD (gastroesophageal reflux disease)    Past Surgical History  Procedure Laterality Date  . Cesarean section    . Abdominal hysterectomy    . Cholecystectomy     Family History  Problem Relation Age of Onset  .  Diabetes Mother   . Hypertension Mother   . Anuerysm Father    Social History  Substance Use Topics  . Smoking status: Never Smoker   . Smokeless tobacco: Never Used  . Alcohol Use: No   OB History    No data available     Review of Systems  Constitutional: Positive for chills. Negative for fever.  Gastrointestinal: Positive for nausea, vomiting, abdominal pain and diarrhea. Negative for blood in stool.  Genitourinary: Negative for dysuria and difficulty urinating.  All other systems reviewed and are negative.  Allergies  Review of patient's allergies indicates no known allergies.  Home Medications   Prior to Admission medications   Medication Sig Start Date End Date Taking? Authorizing Provider  cyclobenzaprine (FLEXERIL) 10 MG tablet Take 1/2 - 1 tablet at bedtime as needed for pain/spasm Patient not taking: Reported on 04/07/2015 06/25/14   Emi Belfasteborah B Gessner, FNP  dimenhyDRINATE (DRAMAMINE) 50 MG tablet Take 50 mg by mouth every 8 (eight) hours as needed for nausea.    Historical Provider, MD  DULoxetine (CYMBALTA) 60 MG capsule Take 1 tablet daily Patient not taking: Reported on 04/07/2015 12/01/14   Collene GobbleSteven A Daub, MD  meloxicam (MOBIC) 15 MG tablet Take 1 tablet (15 mg total) by mouth daily. Patient not taking: Reported on 04/07/2015 06/25/14   Emi Belfasteborah B Gessner, FNP  omeprazole (PRILOSEC) 20 MG capsule Take 20 mg by mouth daily.    Historical Provider, MD  ondansetron (ZOFRAN ODT) 4 MG disintegrating tablet 4mg  ODT q4  hours prn nausea/vomit 04/08/15   Mirian Mo, MD  rizatriptan (MAXALT) 10 MG tablet Take 1 tablet (10 mg total) by mouth as needed for migraine. May repeat in 2 hours if needed 12/01/14   Collene Gobble, MD   BP 109/58 mmHg  Pulse 58  SpO2 99% Physical Exam  Constitutional: She is oriented to person, place, and time. She appears well-developed and well-nourished. No distress.  HENT:  Head: Normocephalic and atraumatic.  Eyes: EOM are normal.  Neck:  Normal range of motion.  Cardiovascular: Normal rate, regular rhythm and normal heart sounds.   Pulmonary/Chest: Effort normal and breath sounds normal.  Abdominal: Soft. She exhibits no distension. There is tenderness.  Mild TTP in the LUQ.   Musculoskeletal: Normal range of motion.  Neurological: She is alert and oriented to person, place, and time.  Skin: Skin is warm and dry.  Psychiatric: She has a normal mood and affect. Judgment normal.  Nursing note and vitals reviewed.  ED Course  Procedures (including critical care time) DIAGNOSTIC STUDIES: Oxygen Saturation is 99% on RA, normal by my interpretation.    COORDINATION OF CARE: 3:10 AM Discussed treatment plan with pt at bedside and pt agreed to plan.   Labs Review Labs Reviewed  COMPREHENSIVE METABOLIC PANEL  CBC WITH DIFFERENTIAL/PLATELET    Imaging Review No results found. I have personally reviewed and evaluated these images and lab results as part of my medical decision-making.  MDM   Final diagnoses:  None    Patient presents here with n/v that has been ongoing for several weeks.  Her symptoms appear to be worse in the morning (she has had a hysterectomy).  She is due for a colonoscopy in about two weeks, but does not feel as though she can wait that long.  Her workup today reveals no significant electrolyte abnormality with the exception of mild hypokalemia.  Her CT shows no acute intra-abdominal process.    She reports little improvement with fluids and medications in the ED.  I have discussed this with Dr. Dulce Sellar from GI who agrees with my assessment that no hospitalization is indicated.  He has offered to have the patient seen sooner in the GI clinic this week.  She is to call on Monday to make these arrangements.  I personally performed the services described in this documentation, which was scribed in my presence. The recorded information has been reviewed and is accurate.       Geoffery Lyons, MD 05/04/15  [phone]

## 2015-05-04 NOTE — ED Notes (Signed)
The pt is c/o nausea and vomiting with chills and sweating  For 2 months.  lmp none

## 2015-05-04 NOTE — Discharge Instructions (Signed)
Phenergan as prescribed as needed for nausea.  Call [facility] on Monday to arrange an expedited follow up appointment.  Dr. Dulce Sellarutlaw is aware of your situation and agrees that you should be seen this week.   Nausea and Vomiting Nausea is a sick feeling that often comes before throwing up (vomiting). Vomiting is a reflex where stomach contents come out of your mouth. Vomiting can cause severe loss of body fluids (dehydration). Children and elderly adults can become dehydrated quickly, especially if they also have diarrhea. Nausea and vomiting are symptoms of a condition or disease. It is important to find the cause of your symptoms. CAUSES   Direct irritation of the stomach lining. This irritation can result from increased acid production (gastroesophageal reflux disease), infection, food poisoning, taking certain medicines (such as nonsteroidal anti-inflammatory drugs), alcohol use, or tobacco use.  Signals from the brain.These signals could be caused by a headache, heat exposure, an inner ear disturbance, increased pressure in the brain from injury, infection, a tumor, or a concussion, pain, emotional stimulus, or metabolic problems.  An obstruction in the gastrointestinal tract (bowel obstruction).  Illnesses such as diabetes, hepatitis, gallbladder problems, appendicitis, kidney problems, cancer, sepsis, atypical symptoms of a heart attack, or eating disorders.  Medical treatments such as chemotherapy and radiation.  Receiving medicine that makes you sleep (general anesthetic) during surgery. DIAGNOSIS Your caregiver may ask for tests to be done if the problems do not improve after a few days. Tests may also be done if symptoms are severe or if the reason for the nausea and vomiting is not clear. Tests may include:  Urine tests.  Blood tests.  Stool tests.  Cultures (to look for evidence of infection).  X-rays or other imaging studies. Test results can help your caregiver  make decisions about treatment or the need for additional tests. TREATMENT You need to stay well hydrated. Drink frequently but in small amounts.You may wish to drink water, sports drinks, clear broth, or eat frozen ice pops or gelatin dessert to help stay hydrated.When you eat, eating slowly may help prevent nausea.There are also some antinausea medicines that may help prevent nausea. HOME CARE INSTRUCTIONS   Take all medicine as directed by your caregiver.  If you do not have an appetite, do not force yourself to eat. However, you must continue to drink fluids.  If you have an appetite, eat a normal diet unless your caregiver tells you differently.  Eat a variety of complex carbohydrates (rice, wheat, potatoes, bread), lean meats, yogurt, fruits, and vegetables.  Avoid high-fat foods because they are more difficult to digest.  Drink enough water and fluids to keep your urine clear or pale yellow.  If you are dehydrated, ask your caregiver for specific rehydration instructions. Signs of dehydration may include:  Severe thirst.  Dry lips and mouth.  Dizziness.  Dark urine.  Decreasing urine frequency and amount.  Confusion.  Rapid breathing or pulse. SEEK IMMEDIATE MEDICAL CARE IF:   You have blood or brown flecks (like coffee grounds) in your vomit.  You have black or bloody stools.  You have a severe headache or stiff neck.  You are confused.  You have severe abdominal pain.  You have chest pain or trouble breathing.  You do not urinate at least once every 8 hours.  You develop cold or clammy skin.  You continue to vomit for longer than 24 to 48 hours.  You have a fever. MAKE SURE YOU:   Understand these  instructions.  Will watch your condition.  Will get help right away if you are not doing well or get worse.   This information is not intended to replace advice given to you by your health care provider. Make sure you discuss any questions you have  with your health care provider.   Document Released: 06/14/2005 Document Revised: 09/06/2011 Document Reviewed: 11/11/2010 Elsevier Interactive Patient Education [employer]

## 2015-05-04 NOTE — ED Notes (Signed)
See downtime forms for previous documentation.

## 2015-05-05 ENCOUNTER — Other Ambulatory Visit: Payer: Self-pay | Admitting: Gastroenterology

## 2015-05-05 DIAGNOSIS — R112 Nausea with vomiting, unspecified: Secondary | ICD-10-CM

## 2015-05-06 ENCOUNTER — Ambulatory Visit: Payer: BC Managed Care – PPO | Admitting: Physician Assistant

## 2015-05-07 ENCOUNTER — Ambulatory Visit
Admission: RE | Admit: 2015-05-07 | Discharge: 2015-05-07 | Disposition: A | Discharge status: Home / Self Care | Payer: BC Managed Care – PPO | Source: Ambulatory Visit | Attending: Gastroenterology | Admitting: Gastroenterology

## 2015-05-07 DIAGNOSIS — R112 Nausea with vomiting, unspecified: Secondary | ICD-10-CM

## 2015-05-15 ENCOUNTER — Other Ambulatory Visit: Payer: Self-pay | Admitting: Gastroenterology

## 2015-06-04 ENCOUNTER — Ambulatory Visit: Payer: BC Managed Care – PPO | Admitting: Gastroenterology

## 2015-07-31 ENCOUNTER — Ambulatory Visit (INDEPENDENT_AMBULATORY_CARE_PROVIDER_SITE_OTHER): Payer: BC Managed Care – PPO | Admitting: Family Medicine

## 2015-07-31 VITALS — BP 118/86 | HR 83 | Temp 98.4°F | Resp 16 | Ht 69.0 in | Wt 132.2 lb

## 2015-07-31 DIAGNOSIS — K219 Gastro-esophageal reflux disease without esophagitis: Secondary | ICD-10-CM | POA: Diagnosis not present

## 2015-07-31 DIAGNOSIS — R634 Abnormal weight loss: Secondary | ICD-10-CM

## 2015-07-31 DIAGNOSIS — R630 Anorexia: Secondary | ICD-10-CM

## 2015-07-31 DIAGNOSIS — F419 Anxiety disorder, unspecified: Secondary | ICD-10-CM

## 2015-07-31 DIAGNOSIS — R197 Diarrhea, unspecified: Secondary | ICD-10-CM

## 2015-07-31 LAB — COMPLETE METABOLIC PANEL WITH GFR
ALT: 14 U/L (ref 6–29)
CO2: 25 mmol/L (ref 20–31)
Chloride: 103 mmol/L (ref 98–110)
GFR, Est African American: >89 mL/min (ref >=60)
GFR, Est Non African American: >89 mL/min (ref >=60)
Glucose, Bld: 98 mg/dL (ref 65–99)
Sodium: 138 mmol/L (ref 135–146)
Total Bilirubin: 0.8 mg/dL (ref 0.2–1.2)
Total Protein: 6.9 g/dL (ref 6.1–8.1)

## 2015-07-31 LAB — POCT CBC
Granulocyte percent: 73.6 % (ref 37–80)
HCT, POC: 42.1 % (ref 37.7–47.9)
Hemoglobin: 14.3 g/dL (ref 12.2–16.2)
Lymph, poc: 2.8 (ref 0.6–3.4)
MCH, POC: 30 pg (ref 27–31.2)
MCHC: 33.9 g/dL (ref 31.8–35.4)
MCV: 88.7 fL (ref 80–97)
MID (cbc): 0.8 (ref 0–0.9)
MPV: 7.3 fL (ref 0–99.8)
POC Granulocyte: 9.9 — AB (ref 2–6.9)
POC LYMPH PERCENT: 20.6 %L (ref 10–50)
POC MID %: 5.8 % (ref 0–12)
Platelet Count, POC: 361 10*3/uL (ref 142–424)
RBC: 4.75 M/uL (ref 4.04–5.48)
RDW, POC: 12.9 %
WBC: 13.5 10*3/uL — AB (ref 4.6–10.2)

## 2015-07-31 LAB — TSH: TSH: 1.525 u[IU]/mL (ref 0.350–4.500)

## 2015-07-31 LAB — POCT SEDIMENTATION RATE: POCT SED RATE: 12 mm/hr (ref 0–22)

## 2015-07-31 LAB — COMPLETE METABOLIC PANEL WITHOUT GFR
AST: 15 U/L (ref 10–30)
Albumin: 4.6 g/dL (ref 3.6–5.1)
Alkaline Phosphatase: 68 U/L (ref 33–115)
BUN: 10 mg/dL (ref 7–25)
Calcium: 9.8 mg/dL (ref 8.6–10.2)
Creat: 0.73 mg/dL (ref 0.50–1.10)
Potassium: 3.9 mmol/L (ref 3.5–5.3)

## 2015-07-31 MED ORDER — DEXLANSOPRAZOLE 30 MG PO CPDR: 30.0000 mg | DELAYED_RELEASE_CAPSULE | Freq: Every day | ORAL | Status: DC

## 2015-07-31 NOTE — Progress Notes (Signed)
 Chief Complaint:  Chief Complaint  Patient presents with  . cold chills and sweats  . Shortness of Breath  . Tachycardia    heart racing    HPI: Rhonda Green is a 45 y.o. female who reports to [facility] today complaining of : intermitten pulsatile sensation m no appetitim heart beat in stomach , better at lunch time.  She has had a lot of stress, does not know if abd issues and weight loss due to stress or something else.  The anxiety wakes her up She has sweats that wake her up, chills and sweats.  She cannot control the stressors in her life.  She has GERD , on PPI x40 bid  On cymbalta  60 mg but tapered down to 30 mg and currently on that , she thought it was not helping then went off of it and then went back on it.  She has tachycardia during that time  and Sob. Then goes away after 4 days. This has been happening since OCtober.   No chest pain during this time. No SI or HI hallucination  Wt Readings from Last 3 Encounters:  08/04/15 133 lb (60.328 kg)  07/31/15 132 lb 3.2 oz (59.966 kg)  04/08/15 150 lb (68.04 kg)     Past Medical History  Diagnosis Date  . Anxiety   . Depression   . GERD (gastroesophageal reflux disease)    Past Surgical History  Procedure Laterality Date  . Cesarean section    . Abdominal hysterectomy    . Cholecystectomy     Social History   Social History  . Marital Status: Legally Separated    Spouse Name: N/A  . Number of Children: N/A  . Years of Education: N/A   Social History Main Topics  . Smoking status: Never Smoker   . Smokeless tobacco: Never Used  . Alcohol Use: No  . Drug Use: No  . Sexual Activity: No   Other Topics Concern  . None   Social History Narrative   Family History  Problem Relation Age of Onset  . Diabetes Mother   . Hypertension Mother   . Anuerysm Father    No Known Allergies Prior to Admission medications   Medication Sig Start Date End Date Taking? Authorizing Provider  DULoxetine  (CYMBALTA) 30 MG capsule Take 30 mg by mouth daily.   Yes Historical Provider, MD  omeprazole (PRILOSEC) 40 MG capsule Take 80 mg by mouth daily.   Yes Historical Provider, MD  rizatriptan (MAXALT) 10 MG tablet Take 1 tablet (10 mg total) by mouth as needed for migraine. May repeat in 2 hours if needed 12/01/14  Yes Collene Gobble, MD     ROS: The patient denies fevers, chills, night sweats,  chest pain, palpitations, wheezing, dyspnea on exertion, nausea, vomiting, abdominal pain, dysuria, hematuria, melena, numbness, weakness, or tingling.   All other systems have been reviewed and were otherwise negative with the exception of those mentioned in the HPI and as above.    PHYSICAL EXAM: Filed Vitals:   07/31/15 1408  BP: 118/86  Pulse: 83  Temp: 98.4 F (36.9 C)  Resp: 16   Body mass index is 19.51 kg/(m^2).   General: Alert, no acute distress HEENT:  Normocephalic, atraumatic, oropharynx patent. EOMI, PERRLA Cardiovascular:  Regular rate and rhythm, no rubs murmurs or gallops.  No Carotid bruits, radial pulse intact. No pedal edema.  Respiratory: Clear to auscultation bilaterally.  No wheezes, rales, or rhonchi.  No cyanosis, no use of accessory musculature Abdominal: No organomegaly, abdomen is soft and non-tender, positive bowel sounds. No masses. Skin: No rashes. Neurologic: Facial musculature symmetric. Psychiatric: Patient acts appropriately throughout our interaction. Lymphatic: No cervical or submandibular lymphadenopathy Musculoskeletal: Gait intact. No edema, tenderness   LABS: Results for orders placed or performed in visit on 07/31/15  COMPLETE METABOLIC PANEL WITH GFR  Result Value Ref Range   Sodium 138 135 - 146 mmol/L   Potassium 3.9 3.5 - 5.3 mmol/L   Chloride 103 98 - 110 mmol/L   CO2 25 20 - 31 mmol/L   Glucose, Bld 98 65 - 99 mg/dL   BUN 10 7 - 25 mg/dL   Creat 1.61 0.96 - 0.45 mg/dL   Total Bilirubin 0.8 0.2 - 1.2 mg/dL   Alkaline Phosphatase 68 33 -  115 U/L   AST 15 10 - 30 U/L   ALT 14 6 - 29 U/L   Total Protein 6.9 6.1 - 8.1 g/dL   Albumin 4.6 3.6 - 5.1 g/dL   Calcium 9.8 8.6 - 40.9 mg/dL   GFR, Est African American >89 >=60 mL/min   GFR, Est Non African American >89 >=60 mL/min  TSH  Result Value Ref Range   TSH 1.525 0.350 - 4.500 uIU/mL  POCT SEDIMENTATION RATE  Result Value Ref Range   POCT SED RATE 12 0 - 22 mm/hr  POCT CBC  Result Value Ref Range   WBC 13.5 (A) 4.6 - 10.2 K/uL   Lymph, poc 2.8 0.6 - 3.4   POC LYMPH PERCENT 20.6 10 - 50 %L   MID (cbc) 0.8 0 - 0.9   POC MID % 5.8 0 - 12 %M   POC Granulocyte 9.9 (A) 2 - 6.9   Granulocyte percent 73.6 37 - 80 %G   RBC 4.75 4.04 - 5.48 M/uL   Hemoglobin 14.3 12.2 - 16.2 g/dL   HCT, POC 81.1 91.4 - 47.9 %   MCV 88.7 80 - 97 fL   MCH, POC 30.0 27 - 31.2 pg   MCHC 33.9 31.8 - 35.4 g/dL   RDW, POC 78.2 %   Platelet Count, POC 361 142 - 424 K/uL   MPV 7.3 0 - 99.8 fL     EKG/XRAY:   Primary read interpreted by Dr. Conley Rolls at [facility].   ASSESSMENT/PLAN: Encounter Diagnoses  Name Primary?  . Gastroesophageal reflux disease, esophagitis presence not specified Yes  . Anxiety   . Loss of weight   . Loss of appetite for more than 2 weeks   . Diarrhea, unspecified type    Labs pending Will get colonscopy / EGD results from [facility] with labs , we discuss different options to see if this is stress ralted or something else. She is not wanting to try too many meds without klnowing cause of issues.   Gross sideeffects, risk and benefits, and alternatives of medications d/w patient. Patient is aware that all medications have potential sideeffects and we are unable to predict every sideeffect or drug-drug interaction that may occur.    DO  08/15/2015 2:56 PM

## 2015-07-31 NOTE — Patient Instructions (Signed)
call me in 2 weeks to see how the dexilant is going, if you feel worse then sooner is ok but I would consider trying this for at least a week.

## 2015-08-02 ENCOUNTER — Telehealth: Payer: Self-pay | Admitting: Family Medicine

## 2015-08-02 DIAGNOSIS — R350 Frequency of micturition: Secondary | ICD-10-CM

## 2015-08-02 DIAGNOSIS — R1013 Epigastric pain: Secondary | ICD-10-CM

## 2015-08-02 NOTE — Telephone Encounter (Signed)
Spoke to patient about labs, she does nto want to take Dexilant after reading SE profile. She Will come in for H pylori breath test and also UA mciro dip and cx as labs only. ORders placed.

## 2015-08-04 ENCOUNTER — Other Ambulatory Visit (INDEPENDENT_AMBULATORY_CARE_PROVIDER_SITE_OTHER): Payer: BC Managed Care – PPO | Admitting: *Deleted

## 2015-08-04 DIAGNOSIS — R1013 Epigastric pain: Secondary | ICD-10-CM

## 2015-08-04 DIAGNOSIS — R350 Frequency of micturition: Secondary | ICD-10-CM

## 2015-08-04 LAB — POCT URINALYSIS DIP (MANUAL ENTRY)
Glucose, UA: NEGATIVE
Nitrite, UA: NEGATIVE
Protein Ur, POC: NEGATIVE
Spec Grav, UA: 1.02
Urobilinogen, UA: 0.2
pH, UA: 6.5

## 2015-08-04 LAB — POC MICROSCOPIC URINALYSIS (UMFC)

## 2015-08-05 LAB — H. PYLORI BREATH TEST: H. pylori Breath Test: NOT DETECTED

## 2015-08-06 ENCOUNTER — Telehealth: Payer: Self-pay | Admitting: Family Medicine

## 2015-08-06 MED ORDER — CEPHALEXIN 500 MG PO CAPS: 500.0000 mg | ORAL_CAPSULE | Freq: Two times a day (BID) | ORAL | Status: DC

## 2015-08-06 NOTE — Telephone Encounter (Signed)
Spoke to patient about labs, UTI , neg H pylori

## 2015-08-07 LAB — URINE CULTURE: Colony Count: >=100000

## 2015-09-01 ENCOUNTER — Ambulatory Visit (INDEPENDENT_AMBULATORY_CARE_PROVIDER_SITE_OTHER): Payer: BC Managed Care – PPO | Admitting: Family Medicine

## 2015-09-01 VITALS — BP 98/56 | HR 114 | Temp 97.9°F | Resp 17 | Ht 69.0 in | Wt 129.0 lb

## 2015-09-01 DIAGNOSIS — K5731 Diverticulosis of large intestine without perforation or abscess with bleeding: Secondary | ICD-10-CM | POA: Diagnosis not present

## 2015-09-01 DIAGNOSIS — K219 Gastro-esophageal reflux disease without esophagitis: Secondary | ICD-10-CM | POA: Diagnosis not present

## 2015-09-01 DIAGNOSIS — R531 Weakness: Secondary | ICD-10-CM

## 2015-09-01 DIAGNOSIS — R1013 Epigastric pain: Secondary | ICD-10-CM

## 2015-09-01 DIAGNOSIS — E860 Dehydration: Secondary | ICD-10-CM

## 2015-09-01 DIAGNOSIS — N390 Urinary tract infection, site not specified: Secondary | ICD-10-CM

## 2015-09-01 DIAGNOSIS — R634 Abnormal weight loss: Secondary | ICD-10-CM | POA: Diagnosis not present

## 2015-09-01 DIAGNOSIS — R002 Palpitations: Secondary | ICD-10-CM

## 2015-09-01 DIAGNOSIS — R8281 Pyuria: Secondary | ICD-10-CM

## 2015-09-01 DIAGNOSIS — K529 Noninfective gastroenteritis and colitis, unspecified: Secondary | ICD-10-CM | POA: Diagnosis not present

## 2015-09-01 DIAGNOSIS — G8929 Other chronic pain: Secondary | ICD-10-CM | POA: Diagnosis not present

## 2015-09-01 DIAGNOSIS — Z87440 Personal history of urinary (tract) infections: Secondary | ICD-10-CM | POA: Diagnosis not present

## 2015-09-01 LAB — POCT CBC
GRANULOCYTE PERCENT: 67.6 % (ref 37–80)
HCT, POC: 40 % (ref 37.7–47.9)
HEMOGLOBIN: 14.1 g/dL (ref 12.2–16.2)
Lymph, poc: 2.3 (ref 0.6–3.4)
MCH: 31.5 pg — AB (ref 27–31.2)
MCHC: 35.3 g/dL (ref 31.8–35.4)
MCV: 89.1 fL (ref 80–97)
MID (cbc): 0.6 (ref 0–0.9)
MPV: 7.6 fL (ref 0–99.8)
POC GRANULOCYTE: 6 (ref 2–6.9)
POC LYMPH PERCENT: 26 %L (ref 10–50)
POC MID %: 6.4 % (ref 0–12)
Platelet Count, POC: 305 10*3/uL (ref 142–424)
RBC: 4.49 M/uL (ref 4.04–5.48)
RDW, POC: 12.6 %
WBC: 8.9 10*3/uL (ref 4.6–10.2)

## 2015-09-01 LAB — COMPLETE METABOLIC PANEL WITH GFR
ALBUMIN: 4.2 g/dL (ref 3.6–5.1)
ALK PHOS: 68 U/L (ref 33–115)
ALT: 18 U/L (ref 6–29)
AST: 19 U/L (ref 10–30)
BUN: 8 mg/dL (ref 7–25)
CALCIUM: 9.4 mg/dL (ref 8.6–10.2)
CHLORIDE: 104 mmol/L (ref 98–110)
CO2: 25 mmol/L (ref 20–31)
Creat: 0.72 mg/dL (ref 0.50–1.10)
GFR, Est Non African American: >89 mL/min (ref >=60)
Glucose, Bld: 92 mg/dL (ref 65–99)
POTASSIUM: 3.8 mmol/L (ref 3.5–5.3)
Sodium: 140 mmol/L (ref 135–146)
Total Bilirubin: 0.7 mg/dL (ref 0.2–1.2)
Total Protein: 6.7 g/dL (ref 6.1–8.1)

## 2015-09-01 LAB — POCT URINALYSIS DIP (MANUAL ENTRY)
Glucose, UA: NEGATIVE
LEUKOCYTES UA: NEGATIVE
NITRITE UA: NEGATIVE
Spec Grav, UA: 1.025
UROBILINOGEN UA: 0.2
pH, UA: 6

## 2015-09-01 LAB — POC MICROSCOPIC URINALYSIS (UMFC)

## 2015-09-01 LAB — LIPASE: Lipase: 5 U/L — ABNORMAL LOW (ref 7–60)

## 2015-09-01 MED ORDER — DICYCLOMINE HCL 10 MG PO CAPS: 10.0000 mg | ORAL_CAPSULE | Freq: Three times a day (TID) | ORAL | Status: DC

## 2015-09-01 NOTE — Patient Instructions (Addendum)
Referral is being made back to Dr. Bosie ClosSchooler to reassess your abdomen  Referral is being made to a cardiologist, Dr. Donnie Ahoilley, for evaluation of palpitations. You may need a monitor test.  You need to push her cell phone drinking fluids and eating. Eat frequent smaller amounts if needed.   Monitor your weight to 3 times a week and keep a listing of how you are doing.  Continue current medications  Take dicyclomine 10 mg before meals and at bedtime to try to reduce both the nausea and diarrhea. It may make her mouth feel a little bit dry.  Plan to return in 3-4 weeks for a recheck. Return sooner or go to the emergency room again if needed.

## 2015-09-01 NOTE — Progress Notes (Signed)
Patient ID: Rhonda Green, female    DOB: Jan 01, 1971  Age: 45 y.o. MRN: [id number]  Chief Complaint  Patient presents with  . Palpitations  . Shortness of Breath    Subjective:   45 year old lady who has been here a month ago and has been to the emergency room and other doctors several times in the last 6 months. She has a history of the last couple of years of having a lot of diarrhea. She hasn't had a cholecystectomy several years ago. She also has had a total hysterectomy so she doesn't menstruate. She last fall began having episodes lasting about a week on almost a monthly basis with palpitations. They wake her up in the morning sometime. It is a fluttering sensation in the low chest and epigastric regions. She has pain in her epigastrium. The thought of food nausea answer. She is not vomiting. She has had a colonoscopy and upper GI. The upper GI study showed a lot of reflux without a hernia. She has had labs done including a TSH and blood chemistries which were normal. The last CBC had a little elevation of the WBC of 13,000, nothing else remarkable. The gastroenterologist she has seen was Dr. Bosie Clos at [facility]. She had a CT scan of the abdomen which was normal about 4 months ago. It did show diverticulosis and the barium swallow did show reflux. She has had an H. pylori test which was negative.  She is on Cymbalta, originally for her migraines. She denies depression. She had wondered about whether anxiety was triggering some of this, so she had gone back on the Cymbalta after having come off of it last summer for a while. That never seems to have made a difference. 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  She is concerned with her weight loss. She feels extremely weak and dry today. She has urinated twice today. She is only drunk a small amount. She would like a little hydration with some IV fluids today,  which sounds fairly appropriate. Current allergies, medications, problem list, past/family and social histories reviewed.  Review of systems: Constitutional: Weak, dizzy HEENT: Unremarkable Cardiovascular: Palpitations as noted GI: As above GU: History of her recent UTI Muscular skeletal: Weakness Dermatologic: Unremarkable Neurologic: History of migraines  psychiatric: Anxiety, denies depression Endocrinologic: Unremarkable. Previous thyroid testing was normal. Has had a hysterectomy.   Objective:  BP 98/56 mmHg  Pulse 114  Temp(Src) 97.9 F (36.6 C) (Oral)  Resp 17  Ht  (1.753 m)  Wt 129 lb (58.514 kg)  BMI 19.04 kg/m2  SpO2 94%  Pleasant lady 45 years old. Feels weak.. She says she's been really weak and lightheaded feeling. Her throat is clear. Neck supple without nodes. No thyromegaly. Chest is clear to auscultation. Heart slightly tachycardic without murmurs. Abdomen is soft without organomegaly or masses. In the area between the umbilicus and the epigastrium is an area that is slightly tender in the midline. Skin warm and dry. Based on the record she has lost 21 pounds since last fall. Skin turgor fair.  Assessment & Plan:   Assessment: 1. Loss of weight   2. Palpitations   3. Abdominal pain, chronic, epigastric   4. Chronic diarrhea   5. Gastroesophageal reflux disease, esophagitis presence not specified   6. Diverticulosis of colon with hemorrhage   7. History of UTI   8. Dehydration   9. Weakness  10. Pyuria       Plan: She has already had a moderate amount of workup done. I'm not certain what is causing her symptoms, we need to keep working to look for any missing items. Will recheck a couple of her labs again EKG to start with.  EKG appears essentially normal. Nonspecific RSR prime in V1  Results for orders placed or performed in visit on 09/01/15  POCT CBC  Result Value Ref Range   WBC 8.9 4.6 - 10.2 K/uL   Lymph, poc 2.3 0.6 - 3.4   POC LYMPH  PERCENT 26.0 10 - 50 %L   MID (cbc) 0.6 0 - 0.9   POC MID % 6.4 0 - 12 %M   POC Granulocyte 6.0 2 - 6.9   Granulocyte percent 67.6 37 - 80 %G   RBC 4.49 4.04 - 5.48 M/uL   Hemoglobin 14.1 12.2 - 16.2 g/dL   HCT, POC 16.140.0 09.637.7 - 47.9 %   MCV 89.1 80 - 97 fL   MCH, POC 31.5 (A) 27 - 31.2 pg   MCHC 35.3 31.8 - 35.4 g/dL   RDW, POC 04.512.6 %   Platelet Count, POC 305 142 - 424 K/uL   MPV 7.6 0 - 99.8 fL  POCT urinalysis dipstick  Result Value Ref Range   Color, UA yellow yellow   Clarity, UA clear clear   Glucose, UA negative negative   Bilirubin, UA moderate (A) negative   Ketones, POC UA large (80) (A) negative   Spec Grav, UA 1.025    Blood, UA trace-lysed (A) negative   pH, UA 6.0    Protein Ur, POC trace (A) negative   Urobilinogen, UA 0.2    Nitrite, UA Negative Negative   Leukocytes, UA Negative Negative  POCT Microscopic Urinalysis ([facility])  Result Value Ref Range   WBC,UR,HPF,POC None None WBC/hpf   RBC,UR,HPF,POC None None RBC/hpf   Bacteria None None, Too numerous to count   Mucus Present (A) Absent   Epithelial Cells, UR Per Microscopy Few (A) None, Too numerous to count cells/hpf     Orders Placed This Encounter  Procedures  . Lipase  . COMPLETE METABOLIC PANEL WITH GFR  . HIV antibody  . Ambulatory referral to Gastroenterology    Referral Priority:  Routine    Referral Type:  Consultation    Referral Reason:  Specialty Services Required    Referred to Provider:  Charlott RakesVincent Schooler, MD    Requested Specialty:  Gastroenterology    Number of Visits Requested:  1  . Ambulatory referral to Cardiology    Referral Priority:  Routine    Referral Type:  Consultation    Referral Reason:  Specialty Services Required    Referred to Provider:  Othella BoyerWilliam S Tilley, MD    Requested Specialty:  Cardiology    Number of Visits Requested:  1  . POCT CBC  . POCT urinalysis dipstick  . POCT Microscopic Urinalysis ([facility])  . EKG 12-Lead    Meds ordered this encounter   Medications  . omeprazole (PRILOSEC) 10 MG capsule    Sig: Take 10 mg by mouth daily.  Marland Kitchen. dicyclomine (BENTYL) 10 MG capsule    Sig: Take 1 capsule (10 mg total) by mouth 4 (four) times daily -  before meals and at bedtime.    Dispense:  40 capsule    Refill:  1    Patient is feeling a little better with fluids and her. Reviewed her CBC with her. The  other labs are still pending. After she gets a urine specimen she can leave. We will try her on the dicyclomine. If that doesn't seem to be helping she is to try taking herself off of the Cymbalta for a week or so and see if that makes her feel any different. Urged her to try and eat and drink more. Return in a few weeks for a follow-up visit.     Patient Instructions  Referral is being made back to Dr. Bosie Clos to reassess your abdomen  Referral is being made to a cardiologist, Dr. Donnie Aho, for evaluation of palpitations. You may need a monitor test.  You need to push her cell phone drinking fluids and eating. Eat frequent smaller amounts if needed.   Monitor your weight to 3 times a week and keep a listing of how you are doing.  Continue current medications  Take dicyclomine 10 mg before meals and at bedtime to try to reduce both the nausea and diarrhea. It may make her mouth feel a little bit dry.  Plan to return in 3-4 weeks for a recheck. Return sooner or go to the emergency room again if needed.   urine has a negative nitrite but the microscopic has some white cells, therefore I had it cultured No Follow-up on file.   Annis Lagoy, MD 09/01/2015

## 2015-09-02 ENCOUNTER — Encounter (HOSPITAL_COMMUNITY): Payer: Self-pay | Admitting: Cardiology

## 2015-09-02 ENCOUNTER — Emergency Department (HOSPITAL_COMMUNITY): Payer: BC Managed Care – PPO

## 2015-09-02 ENCOUNTER — Emergency Department (HOSPITAL_COMMUNITY)
Admission: EM | Admit: 2015-09-02 | Discharge: 2015-09-02 | Disposition: A | Discharge status: Home / Self Care | Payer: BC Managed Care – PPO | Attending: Emergency Medicine | Admitting: Emergency Medicine

## 2015-09-02 DIAGNOSIS — R0602 Shortness of breath: Secondary | ICD-10-CM | POA: Insufficient documentation

## 2015-09-02 DIAGNOSIS — R079 Chest pain, unspecified: Secondary | ICD-10-CM | POA: Insufficient documentation

## 2015-09-02 LAB — I-STAT TROPONIN, ED: TROPONIN I, POC: 0 ng/mL (ref 0.00–0.08)

## 2015-09-02 LAB — CBC
HCT: 39.9 % (ref 36.0–46.0)
HEMOGLOBIN: 13.7 g/dL (ref 12.0–15.0)
MCH: 30 pg (ref 26.0–34.0)
MCHC: 34.3 g/dL (ref 30.0–36.0)
MCV: 87.5 fL (ref 78.0–100.0)
Platelets: 354 10*3/uL (ref 150–400)
RBC: 4.56 MIL/uL (ref 3.87–5.11)
RDW: 12.4 % (ref 11.5–15.5)
WBC: 8.7 10*3/uL (ref 4.0–10.5)

## 2015-09-02 LAB — BASIC METABOLIC PANEL
ANION GAP: 12 (ref 5–15)
BUN: 6 mg/dL (ref 6–20)
CALCIUM: 9.4 mg/dL (ref 8.9–10.3)
CHLORIDE: 107 mmol/L (ref 101–111)
CO2: 20 mmol/L — AB (ref 22–32)
Creatinine, Ser: 0.7 mg/dL (ref 0.44–1.00)
GFR calc non Af Amer: >60 mL/min (ref >60)
Glucose, Bld: 102 mg/dL — ABNORMAL HIGH (ref 65–99)
Potassium: 3.5 mmol/L (ref 3.5–5.1)
Sodium: 139 mmol/L (ref 135–145)

## 2015-09-02 LAB — HIV ANTIBODY (ROUTINE TESTING W REFLEX): HIV: NONREACTIVE

## 2015-09-02 NOTE — ED Notes (Signed)
Reports she has been having intermittent chest pain and SOB since back in October. Was set up to see cardiology on Thursday but has been continuing to have pain

## 2015-09-02 NOTE — ED Notes (Signed)
Pt LWBS [facility] aware. Encouranged pt to stay

## 2015-09-03 LAB — URINE CULTURE: Colony Count: 40000

## 2015-09-04 ENCOUNTER — Encounter: Payer: Self-pay | Admitting: Cardiology

## 2015-09-04 NOTE — Progress Notes (Signed)
Patient ID: Rhonda Green, female   DOB: 04-Oct-1970, 45 y.o.   MRN: [id number]  Rhonda Green, Rhonda Green    Date of visit:  09/04/2015 DOB:  009-Apr-1972    Age:  45 yrs. Medical record number:  [id number]     Account number:  [id number] Primary Care Provider: HOPPER,DAVID ____________________________ CURRENT DIAGNOSES  1. Palpitations  2. Dyspnea ____________________________ ALLERGIES  No Known Allergies ____________________________ MEDICATIONS  1. omeprazole 10 mg capsule,delayed release, 1 p.o. daily  2. Cymbalta 30 mg capsule,delayed release, 1 p.o. daily  3. Maxalt 10 mg tablet, PRN ____________________________ CHIEF COMPLAINTS  dyspnea  palp ____________________________ HISTORY OF PRESENT ILLNESS  This 45 year old female is seen at the request of Dr. Alwyn RenHopper for evaluation of palpitations dyspnea and vague chest pain. The patient is a difficult historian. She has a prior history of obesity but has had a 30 pound weight loss since October. She has a history of some chronic abdominal symptoms going back several years and describes an illness that started in October where she would wake up on the first of each month complaining of tachycardia the last around 4-5 hours. She stated this would cause abdominal pain, dyspnea sweating and make her feel bad and then she was not able to eat. This would usually last the first week or so of each month. Her last episode occurred today and she has continued to have these symptoms. She evidently has seen a gastroenterologist and continues to have diarrhea. She is due to have an endoscopy and has been told that she has reflux and evidently had a colonoscopy also. She may have some vague chest discomfort. She evidently went to the emergency room yesterday but did not stay. She is not able to get any exercise. She is significantly underweight and also complains of some anxiety. She does note some situational stress with her children as well as per work place. She has really not  had an evaluation other than at the urgent care and with the gastroenterologist.  She does have a history of stage I cervical cancer and had a previous radical hysterectomy a number of years ago. She still has her ovaries. She also has lymphedema that was diagnosed at age 45 in her left leg. She denies PND, orthopnea, or claudication. Anything that she mentions is that she was tapered off of Cymbalta that she had previously been taking for migraines in August and then went back on it in January. This did not seem to make any effect on her symptoms. ____________________________ PAST HISTORY  Past Medical Illnesses:  migraine headaches, anxiety;  Cardiovascular Illnesses:  no previous history of cardiac disease.;  Surgical Procedures:  hysterectomy-total, cholecystectomy (lap);  NYHA Classification:  I;  Canadian Angina Classification:  Class 0: Asymptomatic;  Cardiology Procedures-Invasive:  no history of prior cardiac procedures;  Cardiology Procedures-Noninvasive:  no previous non-invasive procedures;  LVEF not documented,   ____________________________ CARDIO-PULMONARY TEST DATES EKG Date:  09/04/2015;   ____________________________ FAMILY HISTORY Brother -- Brother alive and well, Medical history unknown Brother -- Brother alive and well Father -- Father dead, Cerebral infarction Mother -- Mother alive with problem, Diabetes mellitus, Hypertension Sister -- Sister alive with problem, Thyroid disorder, Depression ____________________________ SOCIAL HISTORY Alcohol Use:  no alcohol use;  Smoking:  never smoked;  Diet:  regular diet;  Lifestyle:  divorced;  Exercise:  no regular exercise;  Occupation:  behavior specialist preschoolers;  Residence:  lives with female partner;   ____________________________ REVIEW OF SYSTEMS General:  weight loss of  approximately 30 lbs  Integumentary:no rashes or new skin lesions. Eyes: denies diplopia, history of glaucoma or visual problems. Ears, Nose, Throat,  Mouth:  denies any hearing loss, epistaxis, hoarseness or difficulty speaking. Respiratory: denies dyspnea, cough, wheezing or hemoptysis. Cardiovascular:  please review HPI Abdominal: anorexia and nausea unrelated to mealsGenitourinary-Female: no dysuria, urgency, frequency, UTIs, or stress incontinence Musculoskeletal:  denies arthritis, venous insufficiency, or muscle weakness Neurological:  migraine headaches Psychiatric:  denies depression or anxiety  ____________________________ PHYSICAL EXAMINATION VITAL SIGNS  Blood Pressure:  84/60 Sitting, Right arm, regular cuff  , 80/58 Standing, Right arm and regular cuff   Pulse:  118/min. Weight:  126.00 lbs. Height:  69"BMI: 18  Constitutional:  thin, pleasant white female, in no acute distress Skin:  striae over abdomen Head:  normocephalic, normal hair pattern, no masses or tenderness Eyes:  EOMS Intact, PERRLA, C and S clear, Funduscopic exam not done. ENT:  ears, nose and throat reveal no gross abnormalities.  Dentition good. Neck:  supple, without massess. No JVD, thyromegaly or carotid bruits. Carotid upstroke normal. Chest:  normal symmetry, clear to auscultation. Cardiac:  regular rhythm, normal S1 and S2, No S3 or S4, no murmurs, gallops or rubs detected. Abdomen:  abdomen soft,non-tender, no masses, no hepatospenomegaly, or aneurysm noted Peripheral Pulses:  the femoral,dorsalis pedis, and posterior tibial pulses are full and equal bilaterally with no bruits auscultated. Extremities & Back:  trace edema left leg Neurological:  no gross motor or sensory deficits noted, affect appropriate, oriented x3. ____________________________ IMPRESSIONS/PLAN  1. Unexplained palpitations sweating and dyspnea 2. Unintentional weight loss of 30 pounds 3. Significant anorexia, diarrhea and prior GI illnesses  Recommendations:  Recent lab work reviewed. Would recommend that she wear a cardiac event monitor and have an echocardiogram to evaluate  for structural heart disease. The 30 pound weight loss is concerning and will await the GI evaluation. Thank you for asking me to see her with you. ____________________________ TODAYS ORDERS  1. [place]: Today  2. Return Visit: 1 month  3. 2D, color flow, doppler: [facility]  4. 12 Lead EKG: Today                       ____________________________ Cardiology Physician:  Darden Palmer MD [facility]

## 2015-09-05 ENCOUNTER — Telehealth: Payer: Self-pay | Admitting: Emergency Medicine

## 2015-09-05 NOTE — Telephone Encounter (Signed)
Consult note received from Dr. Donnie Ahoilley. Be sure she also gets in to see her GI doctor as soon as possible. I reviewed the records from Dr. Alwyn RenHopper and feel it is very important she follow closely with the cardiologist and with the gastroenterologist to figure out why she is feeling so poorly

## 2015-09-05 NOTE — Telephone Encounter (Signed)
Left message for pt to call back  °

## 2015-09-05 NOTE — Telephone Encounter (Signed)
Spoke with pt, she has gone to see both this week.

## 2015-09-06 ENCOUNTER — Telehealth: Payer: Self-pay

## 2015-09-06 NOTE — Telephone Encounter (Signed)
Pt was just seen a few days ago for an illness and since then has developed some new symptoms: Vomiting/fever/body ache. She would like us to "call in" some TAMIFLU. I explained that it is very likely that she will need to RTC before we prescribe her this. Pt stated she would TRY to come in and be seen but to go ahead and put this message in.

## 2015-09-08 NOTE — Telephone Encounter (Signed)
Pt needs to RTC.

## 2015-09-09 NOTE — Telephone Encounter (Signed)
Pt advised to RTC. 

## 2015-09-15 ENCOUNTER — Telehealth: Payer: Self-pay

## 2015-09-15 NOTE — Telephone Encounter (Signed)
We received 10 pages from [facility], they have been labeled and sent to scan center on 09/15/15

## 2016-01-28 ENCOUNTER — Other Ambulatory Visit: Payer: Self-pay | Admitting: Emergency Medicine

## 2016-01-28 DIAGNOSIS — F32A Depression, unspecified: Secondary | ICD-10-CM

## 2016-01-28 DIAGNOSIS — F329 Major depressive disorder, single episode, unspecified: Secondary | ICD-10-CM

## 2016-01-29 ENCOUNTER — Ambulatory Visit (INDEPENDENT_AMBULATORY_CARE_PROVIDER_SITE_OTHER): Payer: BC Managed Care – PPO | Admitting: Physician Assistant

## 2016-01-29 ENCOUNTER — Encounter: Payer: Self-pay | Admitting: Physician Assistant

## 2016-01-29 VITALS — BP 104/70 | HR 85 | Temp 98.6°F | Resp 18 | Ht 69.0 in | Wt 135.0 lb

## 2016-01-29 DIAGNOSIS — G43109 Migraine with aura, not intractable, without status migrainosus: Secondary | ICD-10-CM

## 2016-01-29 MED ORDER — RIZATRIPTAN BENZOATE 10 MG PO TABS: 10.0000 mg | ORAL_TABLET | ORAL | 11 refills | Status: DC | PRN

## 2016-01-29 MED ORDER — DULOXETINE HCL 30 MG PO CPEP: 30.0000 mg | ORAL_CAPSULE | Freq: Every day | ORAL | 3 refills | Status: DC

## 2016-01-29 NOTE — Progress Notes (Signed)
01/29/2016 2:41 PM   DOB: 1971-02-09 / MRN: [id number]  SUBJECTIVE:  Rhonda Green is a 45 y.o. female presenting for medication refills.  Reports she was placed on Cymbalta and Maxalt for a neurologist.  Reports before taking cymbalta she was having roughly 10-15 HA days per month, and the Cymbalta cut this to about 6-7 per month for which she takes Maxalt with good relief.  She reports have a total hysterectomy in her surgical history. She is a smoker and is not interested in quitting today.    She describes her HA as right sided with a constant stabbing pain just posterior to the right eye. The HA may last anywhere from 3-4 hours to 1-2 days without the Maxalt, however seems to resolve quickly with the Maxalt.   She denies depression and anhedonia.   With regards to her primary care she plans to establish with another practice within the year.  She admits she is behind on her annual screening but does not want to do this today.  She has No Known Allergies.   She  has a past medical history of Anxiety; Depression; and GERD (gastroesophageal reflux disease).    She  reports that she has never smoked. She has never used smokeless tobacco. She reports that she does not drink alcohol or use drugs. She  reports that she does not engage in sexual activity. The patient  has a past surgical history that includes Cesarean section; Abdominal hysterectomy; and Cholecystectomy.  Her family history includes Anuerysm in her father; Diabetes in her mother; Hypertension in her mother.  Review of Systems  Constitutional: Negative for chills and fever.  Cardiovascular: Negative for chest pain.  Skin: Negative for itching and rash.  Neurological: Positive for headaches. Negative for dizziness.    The problem list and medications were reviewed and updated by myself where necessary and exist elsewhere in the encounter.   OBJECTIVE:  BP 104/70 (BP Location: Left Arm, Patient Position: Sitting, Cuff Size:  Normal)   Pulse 85   Temp 98.6 F (37 C) (Oral)   Resp 18   Ht 5\' 9"  (1.753 m)   Wt 135 lb (61.2 kg)   SpO2 97%   BMI 19.94 kg/m   Wt Readings from Last 3 Encounters:  01/29/16 135 lb (61.2 kg)  09/02/15 129 lb (58.5 kg)  09/01/15 129 lb (58.5 kg)     Physical Exam  Constitutional: She is oriented to person, place, and time. She appears well-developed and well-nourished. No distress.  Cardiovascular: Normal rate and regular rhythm.   Pulmonary/Chest: Effort normal and breath sounds normal.  Abdominal: Soft. Bowel sounds are normal.  Musculoskeletal: Normal range of motion.  Neurological: She is alert and oriented to person, place, and time.  Skin: Skin is warm and dry. She is not diaphoretic.  Psychiatric: She has a normal mood and affect.    No results found for this or any previous visit (from the past 72 hour(s)).  No results found.  ASSESSMENT AND PLAN  Rhonda Green was seen today for medication refill.  Diagnoses and all orders for this visit:  Migraine with aura and without status migrainosus, not intractable: I see no red flags here and this migraine regimen has cut her migraine HA by half and also relieves her symptoms when she has a HA  I will get her enough medication to last a year.  I have encouraged her to establish with a primary soon.   -     rizatriptan (  MAXALT) 10 MG tablet; Take 1 tablet (10 mg total) by mouth as needed for migraine. May repeat in 2 hours if needed -     DULoxetine (CYMBALTA) 30 MG capsule; Take 1 capsule (30 mg total) by mouth daily.    The patient is advised to call or return to clinic if she does not see an improvement in symptoms, or to seek the care of the closest emergency department if she worsens with the above plan.   Deliah Boston, MHS, PA-C [facility] 01/29/2016 2:41 PM

## 2016-01-29 NOTE — Patient Instructions (Signed)
     IF you received an x-ray today, you will receive an invoice from [facility]. Please contact [facility] at [phone] with questions or concerns regarding your invoice.   IF you received labwork today, you will receive an invoice from [facility]. Please contact [facility] at [phone] with questions or concerns regarding your invoice.   Our billing staff will not be able to assist you with questions regarding bills from these companies.  You will be contacted with the lab results as soon as they are available. The fastest way to get your results is to activate your My Chart account. Instructions are located on the last page of this paperwork. If you have not heard from us regarding the results in 2 weeks, please contact this office.      

## 2016-04-21 ENCOUNTER — Ambulatory Visit (INDEPENDENT_AMBULATORY_CARE_PROVIDER_SITE_OTHER): Payer: BC Managed Care – PPO | Admitting: Family Medicine

## 2016-04-21 VITALS — BP 114/78 | HR 90 | Temp 98.7°F | Resp 17 | Ht 69.0 in | Wt 142.0 lb

## 2016-04-21 DIAGNOSIS — F419 Anxiety disorder, unspecified: Secondary | ICD-10-CM

## 2016-04-21 MED ORDER — GABAPENTIN 300 MG PO CAPS: ORAL_CAPSULE | ORAL | 2 refills | Status: DC

## 2016-04-21 NOTE — Patient Instructions (Addendum)
Great to meet you!  Lets see you back in 3 months to see how everything is going.   Try gabapentin in the daytime for as needed anxiety relief, use caution as it may make you sleepy.       IF you received an x-ray today, you will receive an invoice from [facility]. Please contact [facility] at [phone] with questions or concerns regarding your invoice.   IF you received labwork today, you will receive an invoice from [facility]. Please contact [facility] at [phone] with questions or concerns regarding your invoice.   Our billing staff will not be able to assist you with questions regarding bills from these companies.  You will be contacted with the lab results as soon as they are available. The fastest way to get your results is to activate your My Chart account. Instructions are located on the last page of this paperwork. If you have not heard from us regarding the results in 2 weeks, please contact this office.

## 2016-04-21 NOTE — Progress Notes (Addendum)
   HPI  Patient presents today to discuss anxiety.  Patient has been treated with Cymbalta for several years. Over the last 1 year she's had difficulty with IBS-like symptoms and states that she feels that most of her symptoms, after lengthy workup, are due to anxiety.  She has read about gabapentin online and tried a few pills of her friends, she took 600 mg per night for 4 nights in a row and states that it "changed her life."  She denies any suicidal thoughts.  She denies other complaints today.  PMH: Smoking status noted ROS: Per HPI  Objective: BP 114/78 (BP Location: Left Arm, Patient Position: Sitting, Cuff Size: Normal)   Pulse 90   Temp 98.7 F (37.1 C) (Oral)   Resp 17   Ht $RemoveBefo reDEID_[id number]$5\' 9"20.97 kg/m  Gen: NAD, alert, cooperative with exam HEENT: NCAT, EOMI, PERRL CV: RRR, good S1/S2, no murmur Resp: CTABL, no wheezes, non-labored Ext: No edema, warm Neuro: Alert and oriented, No gross deficits  Assessment and plan:  # anxiety Stable Discussed with patient the benefits of gabapentin. Also discussed risks. Starting 600 mg at night, also offered when necessary dosing during the daytime, with the caution of somnolence. Continue Cymbalta Return to clinic in 3 months for follow-up, sooner if needed    Meds ordered this encounter  Medications  . gabapentin (NEURONTIN) 300 MG capsule    Sig: 1 capsule in morning and afternoon, 2 capsules at night as needed for anxiety    Dispense:  120 capsule    Refill:  2    Murtis SinkSam Bradshaw, MD  04/21/2016, 2:02 PM

## 2016-06-22 ENCOUNTER — Encounter (HOSPITAL_COMMUNITY): Payer: Self-pay | Admitting: *Deleted

## 2016-06-22 DIAGNOSIS — K2901 Acute gastritis with bleeding: Secondary | ICD-10-CM | POA: Diagnosis not present

## 2016-06-22 DIAGNOSIS — R0789 Other chest pain: Secondary | ICD-10-CM | POA: Diagnosis present

## 2016-06-22 NOTE — ED Triage Notes (Signed)
The pt has been coughing dark colored blood since yesterday.  She is also c/o chest pain  lmp none

## 2016-06-23 ENCOUNTER — Emergency Department (HOSPITAL_COMMUNITY)
Admission: EM | Admit: 2016-06-23 | Discharge: 2016-06-23 | Disposition: A | Discharge status: Home / Self Care | Payer: BC Managed Care – PPO | Attending: Emergency Medicine | Admitting: Emergency Medicine

## 2016-06-23 ENCOUNTER — Emergency Department (HOSPITAL_COMMUNITY): Payer: BC Managed Care – PPO

## 2016-06-23 DIAGNOSIS — R0789 Other chest pain: Secondary | ICD-10-CM

## 2016-06-23 DIAGNOSIS — K2901 Acute gastritis with bleeding: Secondary | ICD-10-CM

## 2016-06-23 LAB — CBC
HCT: 41.9 % (ref 36.0–46.0)
HEMOGLOBIN: 14.4 g/dL (ref 12.0–15.0)
MCH: 30.4 pg (ref 26.0–34.0)
MCHC: 34.4 g/dL (ref 30.0–36.0)
MCV: 88.6 fL (ref 78.0–100.0)
PLATELETS: 310 10*3/uL (ref 150–400)
RBC: 4.73 MIL/uL (ref 3.87–5.11)
RDW: 13.1 % (ref 11.5–15.5)
WBC: 11.2 10*3/uL — ABNORMAL HIGH (ref 4.0–10.5)

## 2016-06-23 LAB — BASIC METABOLIC PANEL
ANION GAP: 13 (ref 5–15)
BUN: 16 mg/dL (ref 6–20)
CALCIUM: 9.4 mg/dL (ref 8.9–10.3)
CO2: 21 mmol/L — AB (ref 22–32)
CREATININE: 0.84 mg/dL (ref 0.44–1.00)
Chloride: 103 mmol/L (ref 101–111)
Glucose, Bld: 140 mg/dL — ABNORMAL HIGH (ref 65–99)
Potassium: 3 mmol/L — ABNORMAL LOW (ref 3.5–5.1)
SODIUM: 137 mmol/L (ref 135–145)

## 2016-06-23 LAB — POC OCCULT BLOOD, ED: FECAL OCCULT BLD: NEGATIVE

## 2016-06-23 LAB — TROPONIN I

## 2016-06-23 MED ORDER — MORPHINE SULFATE (PF) 4 MG/ML IV SOLN
2.0000 mg | Freq: Once | INTRAVENOUS | Status: DC
  Filled 2016-06-23: qty 1

## 2016-06-23 MED ORDER — ONDANSETRON HCL 4 MG/2ML IJ SOLN
4.0000 mg | Freq: Once | INTRAMUSCULAR | Status: AC
  Administered 2016-06-23: 4 mg via INTRAVENOUS
  Filled 2016-06-23: qty 2

## 2016-06-23 MED ORDER — SUCRALFATE 1 GM/10ML PO SUSP: 1.0000 g | Freq: Three times a day (TID) | ORAL | 0 refills | Status: DC

## 2016-06-23 MED ORDER — GI COCKTAIL ~~LOC~~
30.0000 mL | Freq: Once | ORAL | Status: AC
  Administered 2016-06-23: 30 mL via ORAL
  Filled 2016-06-23: qty 30

## 2016-06-23 MED ORDER — METOCLOPRAMIDE HCL 5 MG/ML IJ SOLN
10.0000 mg | Freq: Once | INTRAMUSCULAR | Status: AC
  Administered 2016-06-23: 10 mg via INTRAVENOUS
  Filled 2016-06-23: qty 2

## 2016-06-23 MED ORDER — RANITIDINE HCL 150 MG PO TABS: 150.0000 mg | ORAL_TABLET | Freq: Two times a day (BID) | ORAL | 0 refills | Status: DC

## 2016-06-23 MED ORDER — FAMOTIDINE IN NACL 20-0.9 MG/50ML-% IV SOLN
20.0000 mg | Freq: Once | INTRAVENOUS | Status: AC
  Administered 2016-06-23: 20 mg via INTRAVENOUS
  Filled 2016-06-23: qty 50

## 2016-06-23 MED ORDER — IOPAMIDOL (ISOVUE-370) INJECTION 76%
INTRAVENOUS | Status: AC
  Administered 2016-06-23: 100 mL via INTRAVENOUS
  Filled 2016-06-23: qty 100

## 2016-06-23 MED ORDER — SODIUM CHLORIDE 0.9 % IV BOLUS (SEPSIS)
1000.0000 mL | Freq: Once | INTRAVENOUS | Status: AC
Start: 2016-06-23 — End: 2016-06-23
  Administered 2016-06-23: 1000 mL via INTRAVENOUS

## 2016-06-23 NOTE — ED Provider Notes (Signed)
[facility]-EMERGENCY DEPT Provider Note   CSN: [id number] Arrival date & time: 06/22/16  2318   By signing my name below, I, Rhonda Green, attest that this documentation has been prepared under the direction and in the presence of Rhonda Creasehristopher J Pollina, MD. Electronically signed, Rhonda Green, ED Scribe. 06/23/16. 5:01 AM.   History   Chief Complaint Chief Complaint  Patient presents with  . Chest Pain   The history is provided by the patient. No language interpreter was used.    HPI Comments: Rhonda Green is a 45 y.o. female who presents to the Emergency Department complaining of nausea and vomit x 2 days. She reports associated cough, bloody vomit beginning this evening, abdominal pain and diarrhea. She notes her symptoms started with nausea and vomiting yesterday. She notes inability to take her Cymbalta and gabapentin secondary to vomiting. She states drinking liquids causes pain to her chest area, and she vomits everything she consumes.    Past Medical History:  Diagnosis Date  . Anxiety   . Depression   . GERD (gastroesophageal reflux disease)     Patient Active Problem List   Diagnosis Date Noted  . Anxiety 02/05/2015    Past Surgical History:  Procedure Laterality Date  . ABDOMINAL HYSTERECTOMY    . CESAREAN SECTION    . CHOLECYSTECTOMY      OB History    No data available       Home Medications    Prior to Admission medications   Medication Sig Start Date End Date Taking? Authorizing Provider  DULoxetine (CYMBALTA) 30 MG capsule Take 1 capsule (30 mg total) by mouth daily. 01/29/16   Ofilia NeasMichael L Clark, PA-C  gabapentin (NEURONTIN) 300 MG capsule 1 capsule in morning and afternoon, 2 capsules at night as needed for anxiety 04/21/16   Elenora GammaSamuel L Bradshaw, MD  omeprazole (PRILOSEC) 10 MG capsule Take 10 mg by mouth daily.    Historical Provider, MD  rizatriptan (MAXALT) 10 MG tablet Take 1 tablet (10 mg total) by mouth as needed for migraine. May repeat in 2 hours  if needed 01/29/16   Ofilia NeasMichael L Clark, PA-C    Family History Family History  Problem Relation Age of Onset  . Diabetes Mother   . Hypertension Mother   . Anuerysm Father     Social History Social History  Substance Use Topics  . Smoking status: Never Smoker  . Smokeless tobacco: Never Used  . Alcohol use No     Allergies   Patient has no known allergies.   Review of Systems Review of Systems  All other systems reviewed and are negative.  A complete 10 system review of systems was obtained and all systems are negative except as noted in the HPI and PMH.    Physical Exam Updated Vital Signs BP 126/86   Pulse 75   Temp 98.7 F (37.1 C) (Oral)   Resp 19   SpO2 100%   Physical Exam  Constitutional: She is oriented to person, place, and time. She appears well-developed and well-nourished. No distress.  HENT:  Head: Normocephalic and atraumatic.  Right Ear: Hearing normal.  Left Ear: Hearing normal.  Nose: Nose normal.  Mouth/Throat: Oropharynx is clear and moist and mucous membranes are normal.  Eyes: Conjunctivae and EOM are normal. Pupils are equal, round, and reactive to light.  Neck: Normal range of motion. Neck supple.  Cardiovascular: Regular rhythm, S1 normal and S2 normal.  Exam reveals no gallop and no friction rub.   No  murmur heard. Pulmonary/Chest: Effort normal and breath sounds normal. No respiratory distress. She exhibits no tenderness.  Abdominal: Soft. Normal appearance and bowel sounds are normal. There is no hepatosplenomegaly. There is no tenderness. There is no rebound, no guarding, no tenderness at McBurney's point and negative Murphy's sign. No hernia.  Musculoskeletal: Normal range of motion.  Neurological: She is alert and oriented to person, place, and time. She has normal strength. No cranial nerve deficit or sensory deficit. Coordination normal. GCS eye subscore is 4. GCS verbal subscore is 5. GCS motor subscore is 6.  Skin: Skin is warm, dry  and intact. No rash noted. No cyanosis.  Psychiatric: She has a normal mood and affect. Her speech is normal and behavior is normal. Thought content normal.  Nursing note and vitals reviewed.    ED Treatments / Results  DIAGNOSTIC STUDIES: Oxygen Saturation is 98% on RA, normal by my interpretation.    COORDINATION OF CARE: 5:01 AM Discussed treatment plan with pt at bedside and pt agreed to plan.  Labs (all labs ordered are listed, but only abnormal results are displayed) Labs Reviewed  BASIC METABOLIC PANEL - Abnormal; Notable for the following:       Result Value   Potassium 3.0 (*)    CO2 21 (*)    Glucose, Bld 140 (*)    All other components within normal limits  CBC - Abnormal; Notable for the following:    WBC 11.2 (*)    All other components within normal limits  TROPONIN I  POC OCCULT BLOOD, ED    EKG  EKG Interpretation  Date/Time:  Tuesday June 22 2016 23:27:10 EST Ventricular Rate:  108 PR Interval:  120 QRS Duration: 90 QT Interval:  340 QTC Calculation: 455 R Axis:   92 Text Interpretation:  Sinus tachycardia Rightward axis Borderline ECG Confirmed by Blinda LeatherwoodPOLLINA  MD, CHRISTOPHER [phone]([id number]) on 06/23/2016 2:27:16 AM       Radiology Dg Chest 2 View  Result Date: 06/23/2016 CLINICAL DATA:  Mid chest pain.  Hematemesis. EXAM: CHEST  2 VIEW COMPARISON:  09/02/2015 FINDINGS: The cardiomediastinal contours are normal. No pneumomediastinum. The lungs are clear. Pulmonary vasculature is normal. No consolidation, pleural effusion, or pneumothorax. No acute osseous abnormalities are seen. IMPRESSION: No active cardiopulmonary disease. Electronically Signed   By: Rhonda OaksMelanie  Green M.D.   On: 06/23/2016 00:20   Ct Angio Chest Pe W Or Wo Contrast  Result Date: 06/23/2016 CLINICAL DATA:  Chest pain.  Vomiting and hematemesis. EXAM: CT ANGIOGRAPHY CHEST WITH CONTRAST TECHNIQUE: Multidetector CT imaging of the chest was performed using the standard protocol during bolus  administration of intravenous contrast. Multiplanar CT image reconstructions and MIPs were obtained to evaluate the vascular anatomy. CONTRAST:  100 cc Isovue 370 IV COMPARISON:  Chest radiograph earlier this day. FINDINGS: Cardiovascular: There are no filling defects within the pulmonary arteries to suggest pulmonary embolus. No aortic dissection. Normal heart size. No pericardial effusion. Mediastinum/Nodes: No mediastinal, hilar, or axillary adenopathy. The esophagus is fluid-filled throughout its course. Thyroid gland is unremarkable. Trachea is midline. Lungs/Pleura: No consolidation or pulmonary edema. 4 mm pulmonary nodule in the left lower lobe series 7, image 113. No pleural fluid. Upper Abdomen: No acute abnormality. Postcholecystectomy. Calcified granuloma in the spleen. Musculoskeletal: There are no acute or suspicious osseous abnormalities. Review of the MIP images confirms the above findings. IMPRESSION: 1. No pulmonary embolus. 2. Fluid-filled esophagus, can be seen with reflux or vomiting. 3. Left lower lobe 4 mm pulmonary nodule.  No follow-up needed if patient is low-risk. Non-contrast chest CT can be considered in 12 months if patient is high-risk. This recommendation follows the consensus statement: Guidelines for Management of Incidental Pulmonary Nodules Detected on CT Images: From the Fleischner Society 2017; Radiology 2017; 284:228-243. Electronically Signed   By: Rhonda Oaks M.D.   On: 06/23/2016 04:05    Procedures Procedures (including critical care time)  Medications Ordered in ED Medications  gi cocktail (Maalox,Lidocaine,Donnatal) (not administered)  morphine 4 MG/ML injection 2 mg (not administered)  metoCLOPramide (REGLAN) injection 10 mg (not administered)  sodium chloride 0.9 % bolus 1,000 mL (0 mLs Intravenous Stopped 06/23/16 0340)  ondansetron (ZOFRAN) injection 4 mg (4 mg Intravenous Given 06/23/16 0306)  famotidine (PEPCID) IVPB 20 mg premix (0 mg Intravenous  Stopped 06/23/16 0336)  iopamidol (ISOVUE-370) 76 % injection (100 mLs Intravenous Contrast Given 06/23/16 0338)     Initial Impression / Assessment and Plan / ED Course  I have reviewed the triage vital signs and the nursing notes.  Pertinent labs & imaging results that were available during my care of the patient were reviewed by me and considered in my medical decision making (see chart for details).  Clinical Course   Patient presented with complaints of chest discomfort with nausea and vomiting. She started having vomiting yesterday and then today she reports that there has been dark vomit, possibly blood in her vomit. She has not been able to tolerate any oral intake today because of the nausea and vomiting, including her medications. She does have a history of GERD. No history of peptic ulcer disease. Rectal exam revealed brown stool, heme-negative. She has not had any emesis here in the ER for testing.  Stable. Hemoglobin is normal, no sign of anemia. Cardiac workup negative and symptoms are extremely atypical for cardiac etiology. Patient treated for acute gastritis with nausea and vomiting. We'll continue treatment as an outpatient, purpura for discharge, follow-up with GI.   At this time there does not appear to be any evidence of an acute emergency medical condition and the patient appears stable for discharge with appropriate outpatient follow up.Diagnosis was discussed with patient who verbalizes understanding and is agreeable to discharge.   Final Clinical Impressions(s) / ED Diagnoses   Final diagnoses:  Atypical chest pain  Acute gastritis with hemorrhage, unspecified gastritis type    New Prescriptions New Prescriptions   No medications on file  I personally performed the services described in this documentation, which was scribed in my presence. The recorded information has been reviewed and is accurate.    Rhonda Crease, MD 06/23/16 [phone]

## 2016-06-23 NOTE — ED Notes (Signed)
EDP at bedside  

## 2016-08-04 ENCOUNTER — Ambulatory Visit (INDEPENDENT_AMBULATORY_CARE_PROVIDER_SITE_OTHER): Payer: BC Managed Care – PPO | Admitting: Urgent Care

## 2016-08-04 VITALS — BP 108/82 | HR 81 | Temp 98.4°F | Resp 17 | Ht 69.0 in | Wt 140.0 lb

## 2016-08-04 DIAGNOSIS — F419 Anxiety disorder, unspecified: Secondary | ICD-10-CM | POA: Diagnosis not present

## 2016-08-04 DIAGNOSIS — K219 Gastro-esophageal reflux disease without esophagitis: Secondary | ICD-10-CM

## 2016-08-04 DIAGNOSIS — E876 Hypokalemia: Secondary | ICD-10-CM

## 2016-08-04 MED ORDER — GABAPENTIN 300 MG PO CAPS: ORAL_CAPSULE | ORAL | 5 refills | Status: DC

## 2016-08-04 MED ORDER — RANITIDINE HCL 150 MG PO TABS: 150.0000 mg | ORAL_TABLET | Freq: Two times a day (BID) | ORAL | 1 refills | Status: DC

## 2016-08-04 NOTE — Patient Instructions (Addendum)
Hypokalemia Hypokalemia means that the amount of potassium in the blood is lower than normal.Potassium is a chemical that helps regulate the amount of fluid in the body (electrolyte). It also stimulates muscle tightening (contraction) and helps nerves work properly.Normally, most of the body's potassium is inside of cells, and only a very small amount is in the blood. Because the amount in the blood is so small, minor changes to potassium levels in the blood can be life-threatening. What are the causes? This condition may be caused by:  Antibiotic medicine.  Diarrhea or vomiting. Taking too much of a medicine that helps you have a bowel movement (laxative) can cause diarrhea and lead to hypokalemia.  Chronic kidney disease (CKD).  Medicines that help the body get rid of excess fluid (diuretics).  Eating disorders, such as bulimia.  Low magnesium levels in the body.  Sweating a lot. What are the signs or symptoms? Symptoms of this condition include:  Weakness.  Constipation.  Fatigue.  Muscle cramps.  Mental confusion.  Skipped heartbeats or irregular heartbeat (palpitations).  Tingling or numbness. How is this diagnosed? This condition is diagnosed with a blood test. How is this treated? Hypokalemia can be treated by taking potassium supplements by mouth or adjusting the medicines that you take. Treatment may also include eating more foods that contain a lot of potassium. If your potassium level is very low, you may need to get potassium through an IV tube in one of your veins and be monitored in the hospital. Follow these instructions at home:  Take over-the-counter and prescription medicines only as told by your health care provider. This includes vitamins and supplements.  Eat a healthy diet. A healthy diet includes fresh fruits and vegetables, whole grains, healthy fats, and lean proteins.  If instructed, eat more foods that contain a lot of potassium, such  as:  Nuts, such as peanuts and pistachios.  Seeds, such as sunflower seeds and pumpkin seeds.  Peas, lentils, and lima beans.  Whole grain and bran cereals and breads.  Fresh fruits and vegetables, such as apricots, avocado, bananas, cantaloupe, kiwi, oranges, tomatoes, asparagus, and potatoes.  Orange juice.  Tomato juice.  Red meats.  Yogurt.  Keep all follow-up visits as told by your health care provider. This is important. Contact a health care provider if:  You have weakness that gets worse.  You feel your heart pounding or racing.  You vomit.  You have diarrhea.  You have diabetes (diabetes mellitus) and you have trouble keeping your blood sugar (glucose) in your target range. Get help right away if:  You have chest pain.  You have shortness of breath.  You have vomiting or diarrhea that lasts for more than 2 days.  You faint. This information is not intended to replace advice given to you by your health care provider. Make sure you discuss any questions you have with your health care provider. Document Released: 06/14/2005 Document Revised: 01/31/2016 Document Reviewed: 01/31/2016 Elsevier Interactive Patient Education  2017 [employer].     IF you received an x-ray today, you will receive an invoice from [facility]. Please contact [facility] at [phone] with questions or concerns regarding your invoice.   IF you received labwork today, you will receive an invoice from [facility]. Please contact [facility] at [phone] with questions or concerns regarding your invoice.   Our billing staff will not be able to assist you with questions regarding bills from these companies.  You will be contacted with the lab  results as soon as they are available. The fastest way to get your results is to activate your My Chart account. Instructions are located on the last page of this paperwork. If you have not heard from us regarding the results in  2 weeks, please contact this office.

## 2016-08-04 NOTE — Progress Notes (Signed)
   MRN: [id number] DOB: 07/06/1970  Subjective:   Rhonda Green is a 46 y.o. female presenting for Medication Refill (gabapentin, zantac)  Anxiety - Patient is requesting refill of Gabapentin. She was started on this at her request with Dr. Ermalinda MemosBradshaw, last OV was 01/29/2016. She has done well with this. She is also taking Cymbalta 30mg . Reports being anxious or fearful in social places/situations, can have sweating, heart racing, shakiness, palpitations, heart racing. However, her symptoms are improved. She has had work up with cardiology and ecg's that were normal. She would like to continue with her gabapentin.  GERD - Has longstanding history of GI issues including decreased appetite, diverticulosis, cholecystectomy. She has been worked up by GI without any known source for her GI issues. However, she has taken Prilosec consistently for years. She reports that her GERD is much better controlled with the addition of ranitidine (Zantac) as needed.  Rhonda Green has a current medication list which includes the following prescription(s): duloxetine, gabapentin, omeprazole, ranitidine, and rizatriptan. Also has No Known Allergies.  Rhonda Green  has a past medical history of Anxiety; Depression; and GERD (gastroesophageal reflux disease). Also  has a past surgical history that includes Cesarean section; Abdominal hysterectomy; and Cholecystectomy.  Objective:   Vitals: BP 108/82 (BP Location: Right Arm, Patient Position: Sitting, Cuff Size: Normal)   Pulse 81   Temp 98.4 F (36.9 C) (Oral)   Resp 17   Ht 5\' 9"  (1.753 m)   Wt 140 lb (63.5 kg)   SpO2 98%   BMI 20.67 kg/m   Physical Exam  Constitutional: She is oriented to person, place, and time. She appears well-developed and well-nourished.  HENT:  Mouth/Throat: Oropharynx is clear and moist.  Eyes: No scleral icterus.  Neck: Normal range of motion. Neck supple. No thyromegaly present.  Cardiovascular: Normal rate, regular rhythm and intact distal pulses.   Exam reveals no gallop and no friction rub.   No murmur heard. Pulmonary/Chest: No respiratory distress. She has no wheezes. She has no rales.  Abdominal: Soft. Bowel sounds are normal. She exhibits no distension and no mass. There is no tenderness. There is no guarding.  Neurological: She is alert and oriented to person, place, and time.  Skin: Skin is warm and dry.   Assessment and Plan :   1. Anxiety - Stable, refilled gabapentin. Recheck with lab results as I suspect that hypokalemia may be source of heart racing, palpitations and associated anxiety.   2. Hypokalemia 3. Gastroesophageal reflux disease, esophagitis presence not specified - Labs pending. Patient had hypokalemia 1 month ago. This may be related to her Prilosec use. Will f/u with results.  Wallis BambergMario Duchess Armendarez, PA-C [facility] [phone] 08/04/2016  2:39 PM

## 2016-08-05 LAB — BASIC METABOLIC PANEL
BUN/Creatinine Ratio: 11 (ref 9–23)
BUN: 7 mg/dL (ref 6–24)
CALCIUM: 9.3 mg/dL (ref 8.7–10.2)
CHLORIDE: 102 mmol/L (ref 96–106)
CO2: 22 mmol/L (ref 18–29)
CREATININE: 0.66 mg/dL (ref 0.57–1.00)
GFR calc Af Amer: 125 mL/min/{1.73_m2} (ref >59)
GFR calc non Af Amer: 109 mL/min/{1.73_m2} (ref >59)
GLUCOSE: 85 mg/dL (ref 65–99)
Potassium: 3.9 mmol/L (ref 3.5–5.2)
Sodium: 141 mmol/L (ref 134–144)

## 2016-08-05 LAB — MAGNESIUM: Magnesium: 1.9 mg/dL (ref 1.6–2.3)

## 2016-08-05 LAB — VITAMIN B12: VITAMIN B 12: 323 pg/mL (ref 232–1245)

## 2016-08-08 LAB — BASIC METABOLIC PANEL WITH GFR

## 2016-08-18 ENCOUNTER — Emergency Department (HOSPITAL_COMMUNITY)
Admission: EM | Admit: 2016-08-18 | Discharge: 2016-08-18 | Disposition: A | Discharge status: Home / Self Care | Payer: BC Managed Care – PPO | Attending: Emergency Medicine | Admitting: Emergency Medicine

## 2016-08-18 ENCOUNTER — Encounter (HOSPITAL_COMMUNITY): Payer: Self-pay | Admitting: Emergency Medicine

## 2016-08-18 DIAGNOSIS — R1013 Epigastric pain: Secondary | ICD-10-CM

## 2016-08-18 LAB — COMPREHENSIVE METABOLIC PANEL
ALT: 24 U/L (ref 14–54)
AST: 23 U/L (ref 15–41)
Albumin: 4.4 g/dL (ref 3.5–5.0)
Alkaline Phosphatase: 65 U/L (ref 38–126)
Anion gap: 9 (ref 5–15)
BUN: 13 mg/dL (ref 6–20)
CO2: 24 mmol/L (ref 22–32)
Calcium: 9.6 mg/dL (ref 8.9–10.3)
Chloride: 102 mmol/L (ref 101–111)
Creatinine, Ser: 0.72 mg/dL (ref 0.44–1.00)
GFR calc Af Amer: >60 mL/min (ref >60)
GFR calc non Af Amer: >60 mL/min (ref >60)
Glucose, Bld: 109 mg/dL — ABNORMAL HIGH (ref 65–99)
Potassium: 3.3 mmol/L — ABNORMAL LOW (ref 3.5–5.1)
Sodium: 135 mmol/L (ref 135–145)
Total Bilirubin: 1.4 mg/dL — ABNORMAL HIGH (ref 0.3–1.2)
Total Protein: 7.6 g/dL (ref 6.5–8.1)

## 2016-08-18 LAB — CBC WITH DIFFERENTIAL/PLATELET
Basophils Absolute: 0 10*3/uL (ref 0.0–0.1)
Basophils Relative: 0 %
Eosinophils Absolute: 0 10*3/uL (ref 0.0–0.7)
Eosinophils Relative: 0 %
HCT: 40 % (ref 36.0–46.0)
Hemoglobin: 14 g/dL (ref 12.0–15.0)
Lymphocytes Relative: 16 %
Lymphs Abs: 1.8 10*3/uL (ref 0.7–4.0)
MCH: 30.8 pg (ref 26.0–34.0)
MCHC: 35 g/dL (ref 30.0–36.0)
MCV: 88.1 fL (ref 78.0–100.0)
Monocytes Absolute: 0.8 10*3/uL (ref 0.1–1.0)
Monocytes Relative: 7 %
Neutro Abs: 8.5 10*3/uL — ABNORMAL HIGH (ref 1.7–7.7)
Neutrophils Relative %: 77 %
Platelets: 315 10*3/uL (ref 150–400)
RBC: 4.54 MIL/uL (ref 3.87–5.11)
RDW: 13.2 % (ref 11.5–15.5)
WBC: 11.1 10*3/uL — ABNORMAL HIGH (ref 4.0–10.5)

## 2016-08-18 LAB — LIPASE, BLOOD: Lipase: 20 U/L (ref 11–51)

## 2016-08-18 MED ORDER — PROMETHAZINE HCL 25 MG/ML IJ SOLN
12.5000 mg | Freq: Once | INTRAMUSCULAR | Status: AC
  Administered 2016-08-18: 12.5 mg via INTRAVENOUS
  Filled 2016-08-18: qty 1

## 2016-08-18 MED ORDER — SODIUM CHLORIDE 0.9 % IV BOLUS (SEPSIS)
1000.0000 mL | Freq: Once | INTRAVENOUS | Status: AC
  Administered 2016-08-18: 1000 mL via INTRAVENOUS

## 2016-08-18 MED ORDER — GI COCKTAIL ~~LOC~~
30.0000 mL | Freq: Once | ORAL | Status: AC
  Administered 2016-08-18: 30 mL via ORAL
  Filled 2016-08-18: qty 30

## 2016-08-18 NOTE — ED Triage Notes (Signed)
Patient is complaining of abdominal pain x 2 days in her epigastric area.  Pain does not radiate.  Denies N/D but some vomiting.

## 2016-08-18 NOTE — ED Provider Notes (Signed)
[facility]-EMERGENCY DEPT Provider Note   CSN: [id number] Arrival date & time: 08/18/16  [id number]     History   Chief Complaint Chief Complaint  Patient presents with  . Abdominal Pain    HPI Jetty PeeksJoy Laroche is a 46 y.o. female.  HPI   46 year old female with abdominal pain. Onset about 2 days ago. Pain is in epigastrium. Does not radiate. Associated nausea and vomiting. No diarrhea. Patient has a history of similar symptoms. She has had extensive workup of this. No fevers or chills. No urinary complaints.  Past Medical History:  Diagnosis Date  . Anxiety   . Depression   . GERD (gastroesophageal reflux disease)     Patient Active Problem List   Diagnosis Date Noted  . Anxiety 02/05/2015    Past Surgical History:  Procedure Laterality Date  . ABDOMINAL HYSTERECTOMY    . CESAREAN SECTION    . CHOLECYSTECTOMY      OB History    No data available       Home Medications    Prior to Admission medications   Medication Sig Start Date End Date Taking? Authorizing Provider  DULoxetine (CYMBALTA) 30 MG capsule Take 1 capsule (30 mg total) by mouth daily. 01/29/16  Yes Ofilia NeasMichael L Clark, PA-C  gabapentin (NEURONTIN) 300 MG capsule 1 capsule in morning and afternoon, 2 capsules at night as needed for anxiety. Patient taking differently: Take 300 mg by mouth 3 (three) times daily.  08/04/16  Yes Wallis BambergMario Mani, PA-C  omeprazole (PRILOSEC OTC) 20 MG tablet Take 20 mg by mouth daily.   Yes Historical Provider, MD  ranitidine (ZANTAC) 150 MG tablet Take 1 tablet (150 mg total) by mouth 2 (two) times daily. Patient taking differently: Take 150 mg by mouth 2 (two) times daily as needed for heartburn.  08/04/16  Yes Wallis BambergMario Mani, PA-C  rizatriptan (MAXALT) 10 MG tablet Take 1 tablet (10 mg total) by mouth as needed for migraine. May repeat in 2 hours if needed 01/29/16  Yes Ofilia NeasMichael L Clark, PA-C    Family History Family History  Problem Relation Age of Onset  . Diabetes Mother   . Hypertension Mother    . Anuerysm Father     Social History Social History  Substance Use Topics  . Smoking status: Never Smoker  . Smokeless tobacco: Never Used  . Alcohol use No     Allergies   Patient has no known allergies.   Review of Systems Review of Systems  All systems reviewed and negative, other than as noted in HPI.   Physical Exam Updated Vital Signs BP 137/68 (BP Location: Left Arm)   Pulse 76   Temp 97.9 F (36.6 C) (Oral)   Resp 18   Ht 5\' 9"  (1.753 m)   Wt 140 lb (63.5 kg)   SpO2 98%   BMI 20.67 kg/m   Physical Exam  Constitutional: She appears well-developed and well-nourished. No distress.  HENT:  Head: Normocephalic and atraumatic.  Eyes: Conjunctivae are normal. Right eye exhibits no discharge. Left eye exhibits no discharge.  Neck: Neck supple.  Cardiovascular: Normal rate, regular rhythm and normal heart sounds.  Exam reveals no gallop and no friction rub.   No murmur heard. Pulmonary/Chest: Effort normal and breath sounds normal. No respiratory distress.  Abdominal: Soft. She exhibits no distension. There is tenderness.  Mild epigastric tenderness w/o rebound or guarding  Musculoskeletal: She exhibits no edema or tenderness.  Neurological: She is alert.  Skin: Skin is warm and dry.  Psychiatric: She has a normal mood and affect. Her behavior is normal. Thought content normal.  Nursing note and vitals reviewed.    ED Treatments / Results  Labs (all labs ordered are listed, but only abnormal results are displayed) Labs Reviewed  CBC WITH DIFFERENTIAL/PLATELET - Abnormal; Notable for the following:       Result Value   WBC 11.1 (*)    Neutro Abs 8.5 (*)    All other components within normal limits  COMPREHENSIVE METABOLIC PANEL - Abnormal; Notable for the following:    Potassium 3.3 (*)    Glucose, Bld 109 (*)    Total Bilirubin 1.4 (*)    All other components within normal limits  LIPASE, BLOOD    EKG  EKG Interpretation None        Radiology No results found.  Procedures Procedures (including critical care time)  Medications Ordered in ED Medications  sodium chloride 0.9 % bolus 1,000 mL (1,000 mLs Intravenous New Bag/Given 08/18/16 1610)  promethazine (PHENERGAN) injection 12.5 mg (12.5 mg Intravenous Given 08/18/16 0823)  gi cocktail (Maalox,Lidocaine,Donnatal) (30 mLs Oral Given 08/18/16 9604)     Initial Impression / Assessment and Plan / ED Course  I have reviewed the triage vital signs and the nursing notes.  Pertinent labs & imaging results that were available during my care of the patient were reviewed by me and considered in my medical decision making (see chart for details).   46 year old female with abdominal pain. This is a recurring issue for her w/o specific diagnosis. She reports EGD as part of this workup. She is status post cholecystectomy. She is already on omeprazole and ranitidine. She is also prescribed Carafate. She reports complete relief of her symptoms with GI cocktail. Her abdominal exam is very reassuring. She is afebrile. Hemodynamically stable. She is able to tolerate liquids in the emergency room.  Final Clinical Impressions(s) / ED Diagnoses   Final diagnoses:  Epigastric pain    New Prescriptions New Prescriptions   No medications on file     Raeford Razor, MD 09/05/16 Rickey Primus

## 2017-01-27 ENCOUNTER — Other Ambulatory Visit: Payer: Self-pay | Admitting: Physician Assistant

## 2017-01-27 DIAGNOSIS — G43109 Migraine with aura, not intractable, without status migrainosus: Secondary | ICD-10-CM

## 2017-02-04 ENCOUNTER — Other Ambulatory Visit: Payer: Self-pay | Admitting: Physician Assistant

## 2017-02-04 DIAGNOSIS — G43109 Migraine with aura, not intractable, without status migrainosus: Secondary | ICD-10-CM

## 2017-02-23 ENCOUNTER — Other Ambulatory Visit: Payer: Self-pay | Admitting: Physician Assistant

## 2017-02-23 DIAGNOSIS — G43109 Migraine with aura, not intractable, without status migrainosus: Secondary | ICD-10-CM

## 2017-02-24 NOTE — Telephone Encounter (Signed)
Given 30 days refill Maxalt. Sent to Scheduling to call pt to establish with PCP here - last refill until seen by a PCP

## 2017-05-05 ENCOUNTER — Ambulatory Visit: Payer: BC Managed Care – PPO | Admitting: Urgent Care

## 2017-05-06 ENCOUNTER — Ambulatory Visit: Payer: BC Managed Care – PPO | Admitting: Urgent Care

## 2017-05-06 ENCOUNTER — Encounter: Payer: Self-pay | Admitting: Urgent Care

## 2017-05-06 VITALS — BP 118/88 | HR 84 | Temp 98.3°F | Resp 16 | Ht 69.0 in | Wt 143.8 lb

## 2017-05-06 DIAGNOSIS — R112 Nausea with vomiting, unspecified: Secondary | ICD-10-CM | POA: Diagnosis not present

## 2017-05-06 DIAGNOSIS — E876 Hypokalemia: Secondary | ICD-10-CM | POA: Diagnosis not present

## 2017-05-06 DIAGNOSIS — F419 Anxiety disorder, unspecified: Secondary | ICD-10-CM

## 2017-05-06 DIAGNOSIS — G43109 Migraine with aura, not intractable, without status migrainosus: Secondary | ICD-10-CM | POA: Diagnosis not present

## 2017-05-06 MED ORDER — DULOXETINE HCL 30 MG PO CPEP: 30.0000 mg | ORAL_CAPSULE | Freq: Every day | ORAL | 1 refills | Status: DC

## 2017-05-06 MED ORDER — ONDANSETRON 8 MG PO TBDP: 8.0000 mg | ORAL_TABLET | Freq: Three times a day (TID) | ORAL | 11 refills | Status: DC | PRN

## 2017-05-06 NOTE — Progress Notes (Signed)
    MRN: [id number] DOB: Sep 08, 1970  Subjective:   Rhonda Green is a 46 y.o. female presenting for follow up on migraines and anxiety, needs medication refill for Cymbalta. Reports that she is doing much better than she was a year ago. Her headaches are better, anxiety is very manageable. She does have episodes of nausea, decreased appetite monthly. Has intermittent heart racing which can be associated with stress and anxiety. Denies HI, SI. Has had extensive work up for her GI symptoms. No clear source for her hypokalemia was ever found. Denies smoking cigarettes.  Rhonda Green has a current medication list which includes the following prescription(s): duloxetine, gabapentin, omeprazole, ranitidine, and rizatriptan. Also has No Known Allergies.  Rhonda Green  has a past medical history of Anxiety, Depression, and GERD (gastroesophageal reflux disease). Also  has a past surgical history that includes Cesarean section; Abdominal hysterectomy; and Cholecystectomy.  Objective:   Vitals: BP 118/88   Pulse 84   Temp 98.3 F (36.8 C) (Oral)   Resp 16   Ht 5\' 9"  (1.753 m)   Wt 143 lb 12.8 oz (65.2 kg)   SpO2 98%   BMI 21.24 kg/m   Physical Exam  Constitutional: She is oriented to person, place, and time. She appears well-developed and well-nourished.  HENT:  Mouth/Throat: Oropharynx is clear and moist.  Eyes: No scleral icterus.  Neck: Normal range of motion. Neck supple. No thyromegaly present.  Cardiovascular: Normal rate, regular rhythm and intact distal pulses. Exam reveals no gallop and no friction rub.  No murmur heard. Pulmonary/Chest: No respiratory distress. She has no wheezes. She has no rales.  Neurological: She is alert and oriented to person, place, and time.  Skin: Skin is warm and dry.  Psychiatric: She has a normal mood and affect.   Assessment and Plan :   1. Migraine with aura and without status migrainosus, not intractable 2. Nausea and vomiting, intractability of vomiting not  specified, unspecified vomiting type 3. Anxiety - Significantly improved, stable. Refills provided for Cymbalta. Labs pending. Follow up in 6 months to 1 year. Okay to provide refills for up to 1 year.  - Comprehensive metabolic panel  4. Hypokalemia - Labs pending.  Wallis BambergMario Bretta Fees, PA-C [facility] [phone] 05/06/2017 5:11 PM

## 2017-05-06 NOTE — Patient Instructions (Addendum)
Duloxetine delayed-release capsules What is this medicine? DULOXETINE (doo LOX e teen) is used to treat depression, anxiety, and different types of chronic pain. This medicine may be used for other purposes; ask your health care provider or pharmacist if you have questions. COMMON BRAND NAME(S): Cymbalta, [place] What should I tell my health care provider before I take this medicine? They need to know if you have any of these conditions: -bipolar disorder or a family history of bipolar disorder -glaucoma -kidney disease -liver disease -suicidal thoughts or a previous suicide attempt -taken medicines called MAOIs like Carbex, Eldepryl, Marplan, Nardil, and Parnate within 14 days -an unusual reaction to duloxetine, other medicines, foods, dyes, or preservatives -pregnant or trying to get pregnant -breast-feeding How should I use this medicine? Take this medicine by mouth with a glass of water. Follow the directions on the prescription label. Do not cut, crush or chew this medicine. You can take this medicine with or without food. Take your medicine at regular intervals. Do not take your medicine more often than directed. Do not stop taking this medicine suddenly except upon the advice of your doctor. Stopping this medicine too quickly may cause serious side effects or your condition may worsen. A special MedGuide will be given to you by the pharmacist with each prescription and refill. Be sure to read this information carefully each time. Talk to your pediatrician regarding the use of this medicine in children. While this drug may be prescribed for children as young as 7 years of age for selected conditions, precautions do apply. Overdosage: If you think you have taken too much of this medicine contact a poison control center or emergency room at once. NOTE: This medicine is only for you. Do not share this medicine with others. What if I miss a dose? If you miss a dose, take it as soon as you  can. If it is almost time for your next dose, take only that dose. Do not take double or extra doses. What may interact with this medicine? Do not take this medicine with any of the following medications: -desvenlafaxine -levomilnacipran -linezolid -MAOIs like Carbex, Eldepryl, Marplan, Nardil, and Parnate -methylene blue (injected into a vein) -milnacipran -thioridazine -venlafaxine This medicine may also interact with the following medications: -alcohol -amphetamines -aspirin and aspirin-like medicines -certain antibiotics like ciprofloxacin and enoxacin -certain medicines for blood pressure, heart disease, irregular heart beat -certain medicines for depression, anxiety, or psychotic disturbances -certain medicines for migraine headache like almotriptan, eletriptan, frovatriptan, naratriptan, rizatriptan, sumatriptan, zolmitriptan -certain medicines that treat or prevent blood clots like warfarin, enoxaparin, and dalteparin -cimetidine -fentanyl -lithium -NSAIDS, medicines for pain and inflammation, like ibuprofen or naproxen -phentermine -procarbazine -rasagiline -sibutramine -St. John's wort -theophylline -tramadol -tryptophan This list may not describe all possible interactions. Give your health care provider a list of all the medicines, herbs, non-prescription drugs, or dietary supplements you use. Also tell them if you smoke, drink alcohol, or use illegal drugs. Some items may interact with your medicine. What should I watch for while using this medicine? Tell your doctor if your symptoms do not get better or if they get worse. Visit your doctor or health care professional for regular checks on your progress. Because it may take several weeks to see the full effects of this medicine, it is important to continue your treatment as prescribed by your doctor. Patients and their families should watch out for new or worsening thoughts of suicide or depression. Also watch out for  sudden changes in   feelings such as feeling anxious, agitated, panicky, irritable, hostile, aggressive, impulsive, severely restless, overly excited and hyperactive, or not being able to sleep. If this happens, especially at the beginning of treatment or after a change in dose, call your health care professional. Bonita QuinYou may get drowsy or dizzy. Do not drive, use machinery, or do anything that needs mental alertness until you know how this medicine affects you. Do not stand or sit up quickly, especially if you are an older patient. This reduces the risk of dizzy or fainting spells. Alcohol may interfere with the effect of this medicine. Avoid alcoholic drinks. This medicine can cause an increase in blood pressure. This medicine can also cause a sudden drop in your blood pressure, which may make you feel faint and increase the chance of a fall. These effects are most common when you first start the medicine or when the dose is increased, or during use of other medicines that can cause a sudden drop in blood pressure. Check with your doctor for instructions on monitoring your blood pressure while taking this medicine. Your mouth may get dry. Chewing sugarless gum or sucking hard candy, and drinking plenty of water may help. Contact your doctor if the problem does not go away or is severe. What side effects may I notice from receiving this medicine? Side effects that you should report to your doctor or health care professional as soon as possible: -allergic reactions like skin rash, itching or hives, swelling of the face, lips, or tongue -anxious -breathing problems -confusion -changes in vision -chest pain -confusion -elevated mood, decreased need for sleep, racing thoughts, impulsive behavior -eye pain -fast, irregular heartbeat -feeling faint or lightheaded, falls -feeling agitated, angry, or irritable -hallucination, loss of contact with reality -high blood pressure -loss of balance or  coordination -palpitations -redness, blistering, peeling or loosening of the skin, including inside the mouth -restlessness, pacing, inability to keep still -seizures -stiff muscles -suicidal thoughts or other mood changes -trouble passing urine or change in the amount of urine -trouble sleeping -unusual bleeding or bruising -unusually weak or tired -vomiting -yellowing of the eyes or skin Side effects that usually do not require medical attention (report to your doctor or health care professional if they continue or are bothersome): -change in sex drive or performance -change in appetite or weight -constipation -dizziness -dry mouth -headache -increased sweating -nausea -tired This list may not describe all possible side effects. Call your doctor for medical advice about side effects. You may report side effects to FDA at 1-800-FDA-1088. Where should I keep my medicine? Keep out of the reach of children. Store at room temperature between 20 and 25 degrees C (68 to 77 degrees F). Throw away any unused medicine after the expiration date. NOTE: This sheet is a summary. It may not cover all possible information. If you have questions about this medicine, talk to your doctor, pharmacist, or health care provider.  2018 Elsevier/Gold Standard (2015-11-13 18:16:03)     IF you received an x-ray today, you will receive an invoice from [facility]. Please contact [facility] at [phone] with questions or concerns regarding your invoice.   IF you received labwork today, you will receive an invoice from [facility]. Please contact [facility] at [phone] with questions or concerns regarding your invoice.   Our billing staff will not be able to assist you with questions regarding bills from these companies.  You will be contacted with the lab results as soon as they are available. The fastest way  to get your results is to activate your My Chart account. Instructions are  located on the last page of this paperwork. If you have not heard from us regarding the results in 2 weeks, please contact this office.

## 2017-05-07 ENCOUNTER — Other Ambulatory Visit: Payer: Self-pay | Admitting: Physician Assistant

## 2017-05-07 DIAGNOSIS — G43109 Migraine with aura, not intractable, without status migrainosus: Secondary | ICD-10-CM

## 2017-05-07 LAB — COMPREHENSIVE METABOLIC PANEL
A/G RATIO: 1.8 (ref 1.2–2.2)
ALBUMIN: 4.2 g/dL (ref 3.5–5.5)
ALT: 14 IU/L (ref 0–32)
AST: 12 IU/L (ref 0–40)
Alkaline Phosphatase: 86 IU/L (ref 39–117)
BILIRUBIN TOTAL: 0.2 mg/dL (ref 0.0–1.2)
BUN / CREAT RATIO: 11 (ref 9–23)
BUN: 8 mg/dL (ref 6–24)
CHLORIDE: 104 mmol/L (ref 96–106)
CO2: 27 mmol/L (ref 20–29)
Calcium: 9.4 mg/dL (ref 8.7–10.2)
Creatinine, Ser: 0.72 mg/dL (ref 0.57–1.00)
GFR calc non Af Amer: 102 mL/min/{1.73_m2} (ref >59)
GFR, EST AFRICAN AMERICAN: 118 mL/min/{1.73_m2} (ref >59)
Globulin, Total: 2.4 g/dL (ref 1.5–4.5)
Glucose: 105 mg/dL — ABNORMAL HIGH (ref 65–99)
POTASSIUM: 3.6 mmol/L (ref 3.5–5.2)
Sodium: 141 mmol/L (ref 134–144)
TOTAL PROTEIN: 6.6 g/dL (ref 6.0–8.5)

## 2017-06-03 ENCOUNTER — Other Ambulatory Visit: Payer: Self-pay | Admitting: Urgent Care

## 2017-06-03 DIAGNOSIS — G43109 Migraine with aura, not intractable, without status migrainosus: Secondary | ICD-10-CM

## 2017-06-15 ENCOUNTER — Telehealth: Payer: Self-pay | Admitting: General Practice

## 2017-06-15 NOTE — Telephone Encounter (Signed)
Copied from CRM [phone]. Topic: Quick Communication - Rx Refill/Question >> Jun 15, 2017 10:05 AM Landry MellowFoltz, Melissa J wrote: Has the patient contacted their pharmacy? Yes.     (Agent: If no, request that the patient contact the pharmacy for the refill.)   Preferred Pharmacy (with phone number or street name): [facility] on [address]. Pt request refill on rizatriptan (MAXALT) 10 MG tablet. Pt cb # [phone]    Agent: Please be advised that RX refills may take up to 3 business days. We ask that you follow-up with your pharmacy.

## 2017-06-15 NOTE — Telephone Encounter (Signed)
Refill   Request    For  Migraine   Medication  Maxalt

## 2017-06-16 ENCOUNTER — Other Ambulatory Visit: Payer: Self-pay

## 2017-06-16 DIAGNOSIS — G43109 Migraine with aura, not intractable, without status migrainosus: Secondary | ICD-10-CM

## 2017-06-16 MED ORDER — RIZATRIPTAN BENZOATE 10 MG PO TABS: ORAL_TABLET | ORAL | 0 refills | Status: DC

## 2017-06-16 NOTE — Telephone Encounter (Signed)
Refill complete 

## 2017-07-19 ENCOUNTER — Encounter (HOSPITAL_COMMUNITY): Payer: Self-pay

## 2017-07-19 ENCOUNTER — Other Ambulatory Visit: Payer: Self-pay

## 2017-07-19 ENCOUNTER — Emergency Department (HOSPITAL_COMMUNITY)
Admission: EM | Admit: 2017-07-19 | Discharge: 2017-07-19 | Disposition: A | Discharge status: Home / Self Care | Payer: BC Managed Care – PPO | Attending: Emergency Medicine | Admitting: Emergency Medicine

## 2017-07-19 DIAGNOSIS — R197 Diarrhea, unspecified: Secondary | ICD-10-CM | POA: Insufficient documentation

## 2017-07-19 DIAGNOSIS — N390 Urinary tract infection, site not specified: Secondary | ICD-10-CM | POA: Diagnosis not present

## 2017-07-19 DIAGNOSIS — R1010 Upper abdominal pain, unspecified: Secondary | ICD-10-CM | POA: Diagnosis present

## 2017-07-19 DIAGNOSIS — Z79899 Other long term (current) drug therapy: Secondary | ICD-10-CM | POA: Insufficient documentation

## 2017-07-19 LAB — CBC
HCT: 40.2 % (ref 36.0–46.0)
HEMOGLOBIN: 13.9 g/dL (ref 12.0–15.0)
MCH: 30.7 pg (ref 26.0–34.0)
MCHC: 34.6 g/dL (ref 30.0–36.0)
MCV: 88.7 fL (ref 78.0–100.0)
Platelets: 378 10*3/uL (ref 150–400)
RBC: 4.53 MIL/uL (ref 3.87–5.11)
RDW: 13.2 % (ref 11.5–15.5)
WBC: 16.1 10*3/uL — ABNORMAL HIGH (ref 4.0–10.5)

## 2017-07-19 LAB — URINALYSIS, ROUTINE W REFLEX MICROSCOPIC
Bilirubin Urine: NEGATIVE
GLUCOSE, UA: NEGATIVE mg/dL
HGB URINE DIPSTICK: NEGATIVE
KETONES UR: 80 mg/dL — AB
NITRITE: POSITIVE — AB
PROTEIN: 30 mg/dL — AB
Specific Gravity, Urine: 1.023 (ref 1.005–1.030)
pH: 7 (ref 5.0–8.0)

## 2017-07-19 LAB — COMPREHENSIVE METABOLIC PANEL
ALK PHOS: 72 U/L (ref 38–126)
ALT: 16 U/L (ref 14–54)
ANION GAP: 6 (ref 5–15)
AST: 21 U/L (ref 15–41)
Albumin: 4.2 g/dL (ref 3.5–5.0)
BILIRUBIN TOTAL: 0.7 mg/dL (ref 0.3–1.2)
BUN: 12 mg/dL (ref 6–20)
CALCIUM: 9.1 mg/dL (ref 8.9–10.3)
CO2: 24 mmol/L (ref 22–32)
Chloride: 109 mmol/L (ref 101–111)
Creatinine, Ser: 0.6 mg/dL (ref 0.44–1.00)
GFR calc Af Amer: >60 mL/min (ref >60)
GFR calc non Af Amer: >60 mL/min (ref >60)
Glucose, Bld: 157 mg/dL — ABNORMAL HIGH (ref 65–99)
Potassium: 3.6 mmol/L (ref 3.5–5.1)
SODIUM: 139 mmol/L (ref 135–145)
TOTAL PROTEIN: 7.4 g/dL (ref 6.5–8.1)

## 2017-07-19 LAB — LIPASE, BLOOD: Lipase: 25 U/L (ref 11–51)

## 2017-07-19 MED ORDER — GI COCKTAIL ~~LOC~~
30.0000 mL | Freq: Once | ORAL | Status: AC
  Administered 2017-07-19: 30 mL via ORAL
  Filled 2017-07-19: qty 30

## 2017-07-19 MED ORDER — CEPHALEXIN 500 MG PO CAPS: 500.0000 mg | ORAL_CAPSULE | Freq: Four times a day (QID) | ORAL | 0 refills | Status: DC

## 2017-07-19 MED ORDER — SODIUM CHLORIDE 0.9 % IV BOLUS (SEPSIS)
500.0000 mL | Freq: Once | INTRAVENOUS | Status: AC
  Administered 2017-07-19: 500 mL via INTRAVENOUS

## 2017-07-19 MED ORDER — PANTOPRAZOLE SODIUM 40 MG IV SOLR
40.0000 mg | Freq: Once | INTRAVENOUS | Status: AC
  Administered 2017-07-19: 40 mg via INTRAVENOUS
  Filled 2017-07-19: qty 40

## 2017-07-19 MED ORDER — PROMETHAZINE HCL 25 MG PO TABS: 25.0000 mg | ORAL_TABLET | Freq: Four times a day (QID) | ORAL | 0 refills | Status: DC | PRN

## 2017-07-19 MED ORDER — SODIUM CHLORIDE 0.9 % IV BOLUS (SEPSIS)
1000.0000 mL | Freq: Once | INTRAVENOUS | Status: AC
  Administered 2017-07-19: 1000 mL via INTRAVENOUS

## 2017-07-19 MED ORDER — HALOPERIDOL LACTATE 5 MG/ML IJ SOLN
2.0000 mg | Freq: Once | INTRAMUSCULAR | Status: AC
  Administered 2017-07-19: 2 mg via INTRAVENOUS
  Filled 2017-07-19: qty 1

## 2017-07-19 MED ORDER — ONDANSETRON 4 MG PO TBDP: 4.0000 mg | ORAL_TABLET | Freq: Once | ORAL | Status: DC

## 2017-07-19 MED ORDER — DEXTROSE 5 % IV SOLN
1.0000 g | Freq: Once | INTRAVENOUS | Status: AC
  Administered 2017-07-19: 1 g via INTRAVENOUS
  Filled 2017-07-19: qty 10

## 2017-07-19 MED ORDER — ONDANSETRON HCL 4 MG/2ML IJ SOLN
4.0000 mg | Freq: Once | INTRAMUSCULAR | Status: AC
  Administered 2017-07-19: 4 mg via INTRAVENOUS
  Filled 2017-07-19: qty 2

## 2017-07-19 MED ORDER — ONDANSETRON 4 MG PO TBDP
4.0000 mg | ORAL_TABLET | Freq: Once | ORAL | Status: AC | PRN
  Administered 2017-07-19: 4 mg via ORAL
  Filled 2017-07-19: qty 1

## 2017-07-19 NOTE — Discharge Instructions (Signed)
Stay very well hydrated with plenty of water throughout the day. Please take antibiotic until completion. Phenergan as needed for nausea. This is not a harmful side effect. Follow up with GI or primary care physician in 1 week for recheck of ongoing symptoms.  Please seek immediate care if you develop the following: Your symptoms are no better or worse in 3 days. There is severe back pain or lower abdominal pain.  You develop chills.  You have a fever.  There is vomiting that is not controlled with medication.  There is continued burning or discomfort with urination.  You have any additional concerns.

## 2017-07-19 NOTE — ED Triage Notes (Signed)
Patient c/o vomiting, mid upper abdominal pain. Patient states the pain is "so bad" that she can not breathe. Patient states, "I usually get a GI cocktail.

## 2017-07-19 NOTE — ED Provider Notes (Signed)
[facility]-EMERGENCY DEPT Provider Note   CSN: [id number] Arrival date & time: 07/19/17  4098     History   Chief Complaint Chief Complaint  Patient presents with  . Emesis  . Gastroesophageal Reflux    HPI Rhonda Green is a 47 y.o. female.  The history is provided by the patient and medical records. No language interpreter was used.   Rhonda Green is a 47 y.o. female  with a PMH of prior cholecystectomy, GERD who presents to the Emergency Department complaining of upper abdominal pain which began around 3 am this morning. She tried taking Prilosec with little improvement.  Associated symptoms include nausea and about 8 episodes of emesis. Patient endorses history of similar in the past.  She states she typically will get a GI cocktail in the emergency department and feels improved.  No fever, chills.  She did have a loose stool this morning which she states is typical for her.  No blood in the stool.  No alleviating or aggravating factors noted.  No chest pain, shortness of breath, back pain, urinary symptoms or vaginal discharge.  Followed by Dr. Bosie Clos of GI.  She has not seen him in the last 2 years. Extensive work up done as outpatient for similar sxs.   Past Medical History:  Diagnosis Date  . Anxiety   . Depression   . GERD (gastroesophageal reflux disease)     Patient Active Problem List   Diagnosis Date Noted  . Anxiety 02/05/2015    Past Surgical History:  Procedure Laterality Date  . ABDOMINAL HYSTERECTOMY    . CESAREAN SECTION    . CHOLECYSTECTOMY      OB History    No data available       Home Medications    Prior to Admission medications   Medication Sig Start Date End Date Taking? Authorizing Provider  DULoxetine (CYMBALTA) 30 MG capsule Take 1 capsule (30 mg total) daily by mouth. 05/06/17  Yes Wallis Bamberg, PA-C  gabapentin (NEURONTIN) 300 MG capsule 1 capsule in morning and afternoon, 2 capsules at night as needed for  anxiety. Patient taking differently: Take 300 mg by mouth 3 (three) times daily.  08/04/16  Yes Wallis Bamberg, PA-C  Multiple Vitamin (MULTIVITAMIN WITH MINERALS) TABS tablet Take 1 tablet by mouth daily.   Yes [provider]  omeprazole (PRILOSEC OTC) 20 MG tablet Take 20 mg by mouth daily.   Yes [provider]  rizatriptan (MAXALT) 10 MG tablet take 1 tablet by mouth if needed for MIGRAINE;  may repeat in 2 hours if needed 06/16/17  Yes Wallis Bamberg, PA-C  cephALEXin (KEFLEX) 500 MG capsule Take 1 capsule (500 mg total) by mouth 4 (four) times daily. 07/19/17   Ward, Chase Picket, PA-C  ondansetron (ZOFRAN-ODT) 8 MG disintegrating tablet Take 1 tablet (8 mg total) every 8 (eight) hours as needed by mouth for nausea. Patient not taking: Reported on 07/19/2017 05/06/17   Wallis Bamberg, PA-C  promethazine (PHENERGAN) 25 MG tablet Take 1 tablet (25 mg total) by mouth every 6 (six) hours as needed for nausea. 07/19/17   Ward, Chase Picket, PA-C    Family History Family History  Problem Relation Age of Onset  . Diabetes Mother   . Hypertension Mother   . Anuerysm Father     Social History Social History   Tobacco Use  . Smoking status: Never Smoker  . Smokeless tobacco: Never Used  Substance Use Topics  . Alcohol use: No  Alcohol/week: 0.0 oz  . Drug use: No     Allergies   Patient has no known allergies.   Review of Systems Review of Systems  Gastrointestinal: Positive for abdominal pain, diarrhea, nausea and vomiting. Negative for blood in stool and constipation.  All other systems reviewed and are negative.    Physical Exam Updated Vital Signs BP 117/64 (BP Location: Left Arm)   Pulse 76   Temp 98.3 F (36.8 C) (Oral)   Resp 14   Ht 5\' 9"  (1.753 m)   Wt 63.5 kg (140 lb)   SpO2 98%   BMI 20.67 kg/m   Physical Exam  Constitutional: She is oriented to person, place, and time. She appears well-developed and well-nourished. No distress.  HENT:  Head:  Normocephalic and atraumatic.  Cardiovascular: Normal rate, regular rhythm and normal heart sounds.  No murmur heard. Pulmonary/Chest: Effort normal and breath sounds normal. No respiratory distress.  Abdominal: Soft. Bowel sounds are normal. She exhibits no distension.  Epigastric tenderness. No rebound or guarding. No CVA tenderness.  Musculoskeletal: She exhibits no edema.  Neurological: She is alert and oriented to person, place, and time.  Skin: Skin is warm and dry.  Nursing note and vitals reviewed.    ED Treatments / Results  Labs (all labs ordered are listed, but only abnormal results are displayed) Labs Reviewed  COMPREHENSIVE METABOLIC PANEL - Abnormal; Notable for the following components:      Result Value   Glucose, Bld 157 (*)    All other components within normal limits  CBC - Abnormal; Notable for the following components:   WBC 16.1 (*)    All other components within normal limits  URINALYSIS, ROUTINE W REFLEX MICROSCOPIC - Abnormal; Notable for the following components:   Color, Urine AMBER (*)    APPearance HAZY (*)    Ketones, ur 80 (*)    Protein, ur 30 (*)    Nitrite POSITIVE (*)    Leukocytes, UA TRACE (*)    Bacteria, UA MANY (*)    Squamous Epithelial / LPF 0-5 (*)    All other components within normal limits  URINE CULTURE  LIPASE, BLOOD    EKG  EKG Interpretation None       Radiology No results found.  Procedures Procedures (including critical care time)  Medications Ordered in ED Medications  ondansetron (ZOFRAN-ODT) disintegrating tablet 4 mg (4 mg Oral Given 07/19/17 0829)  gi cocktail (Maalox,Lidocaine,Donnatal) (30 mLs Oral Given 07/19/17 1142)  sodium chloride 0.9 % bolus 500 mL (0 mLs Intravenous Stopped 07/19/17 1426)  ondansetron (ZOFRAN) injection 4 mg (4 mg Intravenous Given 07/19/17 1141)  pantoprazole (PROTONIX) injection 40 mg (40 mg Intravenous Given 07/19/17 1231)  haloperidol lactate (HALDOL) injection 2 mg (2 mg  Intravenous Given 07/19/17 1232)  cefTRIAXone (ROCEPHIN) 1 g in dextrose 5 % 50 mL IVPB (0 g Intravenous Stopped 07/19/17 1345)  sodium chloride 0.9 % bolus 1,000 mL (0 mLs Intravenous Stopped 07/19/17 1426)     Initial Impression / Assessment and Plan / ED Course  I have reviewed the triage vital signs and the nursing notes.  Pertinent labs & imaging results that were available during my care of the patient were reviewed by me and considered in my medical decision making (see chart for details).    Rhonda Green is a 47 y.o. female who presents to ED for upper abdominal pain, nausea and vomiting. Mild epigastric tenderness with no rebound or guarding. No CVA tenderness.  Patient is nontoxic, nonseptic appearing with a non-surgical abdominal exam. Afebrile, VSS. Blood work with leukocytosis of 16.1. CMP reassuring: glucose 157.  UA nitrite + with 6-30 wbc's and many bacteria. Treated with Rocephin in ED. Hydrated and symptomatic care provided.   On re-evaluation, patient feels improved. She is tolerating PO and feels comfortable with discharge to home. Repeat abdominal exam with no CVA tenderness or peritoneal signs.   Patient discharged home with symptomatic treatment and encouraged to follow up with GI and PCP. I have also discussed reasons to return immediately to the ER at length. Specifically discussed to return to ER immediately for fever, inability to tolerate PO, worsening back pain / abdominal pain or no improvement in symptoms in 3 days. Patient expresses understanding and agrees with plan as dictated above. All questions answered.   Final Clinical Impressions(s) / ED Diagnoses   Final diagnoses:  Urinary tract infection without hematuria, site unspecified    ED Discharge Orders        Ordered    cephALEXin (KEFLEX) 500 MG capsule  4 times daily     07/19/17 1421    promethazine (PHENERGAN) 25 MG tablet  Every 6 hours PRN     07/19/17 1421       Ward, Chase PicketJaime Pilcher,  PA-C 07/19/17 1438    Lorre NickAllen, Anthony, MD 07/20/17 2149

## 2017-07-22 LAB — URINE CULTURE

## 2017-07-23 ENCOUNTER — Telehealth: Payer: Self-pay

## 2017-07-23 NOTE — Telephone Encounter (Signed)
Post ED Visit - Positive Culture Follow-up  Culture report reviewed by antimicrobial stewardship pharmacist:  []  Enzo BiNathan Batchelder, Pharm.D. []  Celedonio MiyamotoJeremy Frens, Pharm.D., BCPS AQ-ID []  Garvin FilaMike Maccia, Pharm.D., BCPS []  Georgina PillionElizabeth Martin, [address] BCPS []  [place] FallsMinh Pham, [address]Ph[address]D., BCPS, AAHIVP []  Estella HuskMichelle Turner, Pharm.D., BCPS, AAHIVP [x]  Lysle Pearlachel Rumbarger, PharmD, BCPS []  Blake DivineShannon Parkey, PharmD []  Pollyann SamplesAndy Johnston, PharmD, BCPS  Positive urine culture Treated with Cephalexin, organism sensitive to the same and no further patient follow-up is required at this time.  Jerry CarasCullom, Roey Coopman Burnett 07/23/2017, 10:22 AM

## 2017-08-03 ENCOUNTER — Other Ambulatory Visit: Payer: Self-pay | Admitting: Urgent Care

## 2017-08-03 DIAGNOSIS — G43109 Migraine with aura, not intractable, without status migrainosus: Secondary | ICD-10-CM

## 2017-08-10 ENCOUNTER — Other Ambulatory Visit: Payer: Self-pay

## 2017-08-10 ENCOUNTER — Encounter: Payer: Self-pay | Admitting: Physician Assistant

## 2017-08-10 ENCOUNTER — Ambulatory Visit: Payer: BC Managed Care – PPO | Admitting: Physician Assistant

## 2017-08-10 VITALS — BP 120/78 | HR 96 | Temp 97.8°F | Resp 18 | Ht 69.02 in | Wt 137.8 lb

## 2017-08-10 DIAGNOSIS — R82998 Other abnormal findings in urine: Secondary | ICD-10-CM | POA: Diagnosis not present

## 2017-08-10 DIAGNOSIS — R102 Pelvic and perineal pain: Secondary | ICD-10-CM

## 2017-08-10 LAB — POC MICROSCOPIC URINALYSIS (UMFC): Mucus: ABSENT

## 2017-08-10 LAB — POCT CBC
Granulocyte percent: 68.7 % (ref 37–80)
HCT, POC: 41.6 % (ref 37.7–47.9)
Hemoglobin: 13.9 g/dL (ref 12.2–16.2)
Lymph, poc: 2.7 (ref 0.6–3.4)
MCH, POC: 29.8 pg (ref 27–31.2)
MCHC: 33.4 g/dL (ref 31.8–35.4)
MCV: 89.2 fL (ref 80–97)
MID (cbc): 0.3 (ref 0–0.9)
MPV: 7.6 fL (ref 0–99.8)
POC Granulocyte: 6.6 (ref 2–6.9)
POC LYMPH PERCENT: 27.8 % (ref 10–50)
POC MID %: 3.5 % (ref 0–12)
Platelet Count, POC: 380 K/uL (ref 142–424)
RBC: 4.66 M/uL (ref 4.04–5.48)
RDW, POC: 13.3 %
WBC: 9.6 K/uL (ref 4.6–10.2)

## 2017-08-10 LAB — POCT URINALYSIS DIP (MANUAL ENTRY)
Glucose, UA: NEGATIVE mg/dL
Nitrite, UA: POSITIVE — AB
Protein Ur, POC: 30 mg/dL — AB
Spec Grav, UA: 1.025
Urobilinogen, UA: 1 U/dL
pH, UA: 6

## 2017-08-10 MED ORDER — NITROFURANTOIN MONOHYD MACRO 100 MG PO CAPS: 100.0000 mg | ORAL_CAPSULE | Freq: Two times a day (BID) | ORAL | 0 refills | Status: AC

## 2017-08-10 NOTE — Progress Notes (Signed)
08/10/2017 at 6:06 PM  Jetty Peeks / DOB: Nov 11, 1970 / MRN: [id number]  The patient has Anxiety on their problem list.  SUBJECTIVE  Rhonda Green is a 47 y.o. female who complains of urinary issues. Was seen in ED on 07/19/17. Dx with UTI. Given keflex. Took maybe half of the course but then stopped due to diarrhea, yeast infection, and feet swelling, which all resolved when she stopped the abx. After a few days felt her urinary symptoms return. Now has cloudy malodorous urine, suprapubic pressure, low back pain, chills, and urinary urgency. She denies fever, flank pain, dysuria, hematuria, genital irritation and vaginal discharge. Pt is sexually active with monogamous partner. Had hysterectomy in 2004.   She  has a past medical history of Anxiety, Depression, and GERD (gastroesophageal reflux disease).    Medications reviewed and updated by myself where necessary, and exist elsewhere in the encounter.   Ms. Fronek has No Known Allergies. She  reports that  has never smoked. she has never used smokeless tobacco. She reports that she does not drink alcohol or use drugs. She  reports that she currently engages in sexual activity. The patient  has a past surgical history that includes Cesarean section; Abdominal hysterectomy; and Cholecystectomy.  Her family history includes Anuerysm in her father; Diabetes in her mother; Hypertension in her mother.  Review of Systems  Gastrointestinal: Negative for nausea and vomiting.    OBJECTIVE  Her  height is 5' 9.02" (1.753 m) and weight is 137 lb 12.8 oz (62.5 kg). Her oral temperature is 97.8 F (36.6 C). Her blood pressure is 120/78 and her pulse is 96. Her respiration is 18 and oxygen saturation is 99%.  The patient's body mass index is 20.34 kg/m.  Physical Exam  Constitutional: She is oriented to person, place, and time. She appears well-developed and well-nourished.  HENT:  Head: Normocephalic and atraumatic.  Eyes: Conjunctivae are normal.    Neck: Normal range of motion.  Pulmonary/Chest: Effort normal.  Abdominal: Soft. Normal appearance and bowel sounds are normal. There is tenderness in the suprapubic area. There is no CVA tenderness.  Neurological: She is alert and oriented to person, place, and time.  Skin: Skin is warm and dry.  Psychiatric: She has a normal mood and affect.  Vitals reviewed.   Results for orders placed or performed in visit on 08/10/17 (from the past 24 hour(s))  POCT urinalysis dipstick     Status: Abnormal   Collection Time: 08/10/17  5:33 PM  Result Value Ref Range   Color, UA yellow yellow   Clarity, UA cloudy (A) clear   Glucose, UA negative negative mg/dL   Bilirubin, UA small (A) negative   Ketones, POC UA small (15) (A) negative mg/dL   Spec Grav, UA 4.098 1.191 - 1.025   Blood, UA trace-lysed (A) negative   pH, UA 6.0 5.0 - 8.0   Protein Ur, POC =30 (A) negative mg/dL   Urobilinogen, UA 1.0 0.2 or 1.0 E.U./dL   Nitrite, UA Positive (A) Negative   Leukocytes, UA Small (1+) (A) Negative  POCT Microscopic Urinalysis ([facility])     Status: Abnormal   Collection Time: 08/10/17  5:39 PM  Result Value Ref Range   WBC,UR,HPF,POC Too numerous to count  (A) None WBC/hpf   RBC,UR,HPF,POC None None RBC/hpf   Bacteria Too numerous to count  None, Too numerous to count   Mucus Absent Absent   Epithelial Cells, UR Per Microscopy None None, Too numerous to  count cells/hpf  POCT CBC     Status: None   Collection Time: 08/10/17  5:44 PM  Result Value Ref Range   WBC 9.6 4.6 - 10.2 K/uL   Lymph, poc 2.7 0.6 - 3.4   POC LYMPH PERCENT 27.8 10 - 50 %L   MID (cbc) 0.3 0 - 0.9   POC MID % 3.5 0 - 12 %M   POC Granulocyte 6.6 2 - 6.9   Granulocyte percent 68.7 37 - 80 %G   RBC 4.66 4.04 - 5.48 M/uL   Hemoglobin 13.9 12.2 - 16.2 g/dL   HCT, POC 09.841.6 11.937.7 - 47.9 %   MCV 89.2 80 - 97 fL   MCH, POC 29.8 27 - 31.2 pg   MCHC 33.4 31.8 - 35.4 g/dL   RDW, POC 14.713.3 %   Platelet Count, POC 380 142 - 424 K/uL    MPV 7.6 0 - 99.8 fL    ASSESSMENT & PLAN  Kamorah was seen today for medication problem.  Diagnoses and all orders for this visit:  Suprapubic pressure -     POCT urinalysis dipstick -     POCT Microscopic Urinalysis ([facility]) -     POCT CBC -     Urine Culture  Leukocytes in urine -     nitrofurantoin, macrocrystal-monohydrate, (MACROBID) 100 MG capsule; Take 1 capsule (100 mg total) by mouth 2 (two) times daily for 5 days.   History, physical exam, and point-of-care testing consistent with persistent UTI due to incompletion of antibiotic course.  She is overall well-appearing, no distress.  Vital stable.  No CVA tenderness.  WBC WNL.  Prior urine culture from ED visit on 07/19/17 reviewed.  Urine culture showed E. Coli, which was susceptible to Macrobid.  Recommend treating with Macrobid at this time.  Urine culture pending. The patient was advised to call or come back to clinic if she does not see an improvement in symptoms, or worsens with the above plan.   Benjiman CoreBrittany Mayci Haning, PA-C  Primary Care at [facility] 08/10/2017 6:06 PM

## 2017-08-10 NOTE — Progress Notes (Deleted)
   Rhonda Green  MRN: [id number] DOB: Oct 29, 1970  Subjective:  Rhonda Green is a 47 y.o. female seen in office today for a chief complaint of ***  Review of Systems  Patient Active Problem List   Diagnosis Date Noted  . Anxiety 02/05/2015    Current Outpatient Medications on File Prior to Visit  Medication Sig Dispense Refill  . DULoxetine (CYMBALTA) 30 MG capsule Take 1 capsule (30 mg total) daily by mouth. 90 capsule 1  . gabapentin (NEURONTIN) 300 MG capsule 1 capsule in morning and afternoon, 2 capsules at night as needed for anxiety. (Patient taking differently: Take 300 mg by mouth 3 (three) times daily. ) 120 capsule 5  . Multiple Vitamin (MULTIVITAMIN WITH MINERALS) TABS tablet Take 1 tablet by mouth daily.    Marland Kitchen. omeprazole (PRILOSEC OTC) 20 MG tablet Take 20 mg by mouth daily.    . ondansetron (ZOFRAN-ODT) 8 MG disintegrating tablet Take 1 tablet (8 mg total) every 8 (eight) hours as needed by mouth for nausea. 15 tablet 11  . promethazine (PHENERGAN) 25 MG tablet Take 1 tablet (25 mg total) by mouth every 6 (six) hours as needed for nausea. 20 tablet 0  . rizatriptan (MAXALT) 10 MG tablet take 1 tablet by mouth if needed for migraines may repeat in 2 hours if needed 12 tablet 0  . cephALEXin (KEFLEX) 500 MG capsule Take 1 capsule (500 mg total) by mouth 4 (four) times daily. (Patient not taking: Reported on 08/10/2017) 40 capsule 0   No current facility-administered medications on file prior to visit.     No Known Allergies   Objective:  BP 120/78 (BP Location: Right Arm, Patient Position: Sitting, Cuff Size: Normal)   Pulse 96   Temp 97.8 F (36.6 C) (Oral)   Resp 18   Ht 5' 9.02" (1.753 m)   Wt 137 lb 12.8 oz (62.5 kg)   SpO2 99%   BMI 20.34 kg/m   Physical Exam  Assessment and Plan :  *** There are no diagnoses linked to this encounter.   Benjiman CoreBrittany Kristell Wooding PA-C  [facility] at [facility]  [facility] 08/10/2017 5:22 PM

## 2017-08-10 NOTE — Patient Instructions (Addendum)
  Your results indicate you have a UTI. I have given you a prescription for an antibiotic. Please take with food. I have sent off a urine culture and we should have those results in 48 hours. If your symptoms worsen while you are awaiting these results or you develop fever, chills, flank pian, nausea and vomiting, please seek care immediately.    Urinary Tract Infection, Adult A urinary tract infection (UTI) is an infection of any part of the urinary tract. The urinary tract includes the:  Kidneys.  Ureters.  Bladder.  Urethra.  These organs make, store, and get rid of pee (urine) in the body. Follow these instructions at home:  Take over-the-counter and prescription medicines only as told by your doctor.  If you were prescribed an antibiotic medicine, take it as told by your doctor. Do not stop taking the antibiotic even if you start to feel better.  Avoid the following drinks: ? Alcohol. ? Caffeine. ? Tea. ? Carbonated drinks.  Drink enough fluid to keep your pee clear or pale yellow.  Keep all follow-up visits as told by your doctor. This is important.  Make sure to: ? Empty your bladder often and completely. Do not to hold pee for long periods of time. ? Empty your bladder before and after sex. ? Wipe from front to back after a bowel movement if you are female. Use each tissue one time when you wipe. Contact a doctor if:  You have back pain.  You have a fever.  You feel sick to your stomach (nauseous).  You throw up (vomit).  Your symptoms do not get better after 3 days.  Your symptoms go away and then come back. Get help right away if:  You have very bad back pain.  You have very bad lower belly (abdominal) pain.  You are throwing up and cannot keep down any medicines or water. This information is not intended to replace advice given to you by your health care provider. Make sure you discuss any questions you have with your health care provider. Document  Released: 12/01/2007 Document Revised: 11/20/2015 Document Reviewed: 05/05/2015 Elsevier Interactive Patient Education  2018 Elsevier Inc.    IF you received an x-ray today, you will receive an invoice from [facility]. Please contact [facility] at [phone] with questions or concerns regarding your invoice.   IF you received labwork today, you will receive an invoice from [facility]. Please contact [facility] at [phone] with questions or concerns regarding your invoice.   Our billing staff will not be able to assist you with questions regarding bills from these companies.  You will be contacted with the lab results as soon as they are available. The fastest way to get your results is to activate your My Chart account. Instructions are located on the last page of this paperwork. If you have not heard from us regarding the results in 2 weeks, please contact this office.     

## 2017-08-31 ENCOUNTER — Telehealth: Payer: Self-pay

## 2017-08-31 MED ORDER — GABAPENTIN 300 MG PO CAPS: ORAL_CAPSULE | ORAL | 5 refills | Status: DC

## 2017-08-31 NOTE — Telephone Encounter (Signed)
Copied from CRM [id number]. Topic: Inquiry >> Aug 30, 2017 12:29 PM Yvonna Alanisobinson, Andra M wrote: Reason for CRM: [facility] Gabapentin (NEURONTIN) 300 MG capsule. Patient's preferred pharmacy is [facility] [phone]#[id number] - [place], [place] - [address] [facility] [address] [phone] (Phone)    [phone] (Fax).      Thank You!!!

## 2017-08-31 NOTE — Telephone Encounter (Signed)
Provider, per 05/06/2017 OV note, cymbalta OK'ed for refills for up to a year, same with gabapentin? Please advise.

## 2017-08-31 NOTE — Telephone Encounter (Signed)
Refill sent electronically. Please notify patient.

## 2017-10-24 ENCOUNTER — Other Ambulatory Visit: Payer: Self-pay | Admitting: Urgent Care

## 2017-10-24 DIAGNOSIS — G43109 Migraine with aura, not intractable, without status migrainosus: Secondary | ICD-10-CM

## 2017-10-25 NOTE — Telephone Encounter (Signed)
LOV 05/06/17 Mr. Mani Last refill 08/03/17  # 12   0 refills

## 2017-11-04 ENCOUNTER — Other Ambulatory Visit: Payer: Self-pay | Admitting: Urgent Care

## 2017-11-04 DIAGNOSIS — G43109 Migraine with aura, not intractable, without status migrainosus: Secondary | ICD-10-CM

## 2017-11-14 ENCOUNTER — Other Ambulatory Visit: Payer: Self-pay

## 2017-11-14 ENCOUNTER — Telehealth: Payer: Self-pay | Admitting: Urgent Care

## 2017-11-14 ENCOUNTER — Other Ambulatory Visit: Payer: Self-pay | Admitting: Urgent Care

## 2017-11-14 DIAGNOSIS — G43109 Migraine with aura, not intractable, without status migrainosus: Secondary | ICD-10-CM

## 2017-11-14 MED ORDER — DULOXETINE HCL 30 MG PO CPEP: 30.0000 mg | ORAL_CAPSULE | Freq: Every day | ORAL | 1 refills | Status: DC

## 2017-11-14 NOTE — Telephone Encounter (Addendum)
Copied from CRM [id number]. Topic: Quick Communication - Rx Refill/Question >> Nov 14, 2017  9:45 AM Rhonda Green wrote: Medication: DULoxetine (CYMBALTA) 30 MG capsule  Has the patient contacted their pharmacy? Yes pt states she has called the pharmacy several times, but no response Pt requesting refill. Please advise if pt needs an appt  [facility] [phone] - [name], [place] - [address] AT [facility] [address] [phone] (Phone) [phone] (Fax)

## 2017-12-15 ENCOUNTER — Other Ambulatory Visit: Payer: Self-pay | Admitting: Urgent Care

## 2017-12-15 DIAGNOSIS — G43109 Migraine with aura, not intractable, without status migrainosus: Secondary | ICD-10-CM

## 2017-12-16 NOTE — Telephone Encounter (Signed)
Left message for pt contact office to see if refill of Rizatriptan was needed at this time.

## 2017-12-16 NOTE — Telephone Encounter (Signed)
Relation to pt: self  Call back number: [phone]  Pharmacy:   Reason for call:  Patient returned call to confirm medication mentioned below is needed and please refill.

## 2018-01-17 ENCOUNTER — Other Ambulatory Visit: Payer: Self-pay | Admitting: Urgent Care

## 2018-01-17 DIAGNOSIS — G43109 Migraine with aura, not intractable, without status migrainosus: Secondary | ICD-10-CM

## 2018-01-19 NOTE — Telephone Encounter (Signed)
LV 12/16/17 Pt is requesting Rizatriptan 10mg  Pharmacy:verified

## 2018-01-19 NOTE — Telephone Encounter (Signed)
Refill provided, recommend office visit for additional refills.

## 2018-01-19 NOTE — Telephone Encounter (Signed)
Copied from CRM [id number]. Topic: Quick Communication - Rx Refill/Question >> Jan 19, 2018  2:07 PM Alexander BergeronBarksdale, Harvey B wrote: Medication: rizatriptan (MAXALT) 10 MG tablet [[id number]][[id number]]   Has the patient contacted their pharmacy? Yes.   (Agent: If no, request that the patient contact the pharmacy for the refill.) (Agent: If yes, when and what did the pharmacy advise?)  Preferred Pharmacy (with phone number or street name): [facility] [place]  Agent: Please be advised that RX refills may take up to 3 business days. We ask that you follow-up with your pharmacy.

## 2018-01-19 NOTE — Telephone Encounter (Signed)
Maxalt 10 mg refill request  LOV 05/06/17 with Wallis BambergMario Mani  Last refill:  12/16/17  #10  Refills:  0  [id number] - [place], [place]

## 2018-01-23 ENCOUNTER — Encounter: Payer: Self-pay | Admitting: Urgent Care

## 2018-01-24 ENCOUNTER — Encounter: Payer: Self-pay | Admitting: Urgent Care

## 2018-01-24 ENCOUNTER — Ambulatory Visit: Payer: BC Managed Care – PPO | Admitting: Urgent Care

## 2018-01-24 VITALS — BP 110/74 | HR 85 | Temp 98.5°F | Resp 16 | Ht 69.0 in | Wt 128.6 lb

## 2018-01-24 DIAGNOSIS — G43109 Migraine with aura, not intractable, without status migrainosus: Secondary | ICD-10-CM

## 2018-01-24 DIAGNOSIS — R112 Nausea with vomiting, unspecified: Secondary | ICD-10-CM | POA: Diagnosis not present

## 2018-01-24 DIAGNOSIS — R350 Frequency of micturition: Secondary | ICD-10-CM | POA: Diagnosis not present

## 2018-01-24 DIAGNOSIS — N3090 Cystitis, unspecified without hematuria: Secondary | ICD-10-CM

## 2018-01-24 DIAGNOSIS — R300 Dysuria: Secondary | ICD-10-CM | POA: Diagnosis not present

## 2018-01-24 LAB — POCT URINALYSIS DIP (MANUAL ENTRY)
Bilirubin, UA: NEGATIVE
Glucose, UA: NEGATIVE mg/dL
NITRITE UA: POSITIVE — AB
PH UA: 6.5 (ref 5.0–8.0)
Protein Ur, POC: NEGATIVE mg/dL
Spec Grav, UA: 1.025 (ref 1.010–1.025)
UROBILINOGEN UA: 0.2 U/dL

## 2018-01-24 MED ORDER — RIZATRIPTAN BENZOATE 10 MG PO TABS: ORAL_TABLET | ORAL | 5 refills | Status: DC

## 2018-01-24 MED ORDER — CEPHALEXIN 500 MG PO CAPS: 500.0000 mg | ORAL_CAPSULE | Freq: Two times a day (BID) | ORAL | 0 refills | Status: DC

## 2018-01-24 NOTE — Progress Notes (Signed)
MRN: [id number] DOB: 1970/10/21  Subjective:   Rhonda Green is a 47 y.o. female presenting for 1 week history of dysuria and urinary frequency, nausea and vomiting, body aches.  Her previous UTI presented very much the same way.  Has not tried medications for relief.  Denies fever, hematuria, flank pain, abdominal pain, pelvic pain, genital rash and vaginal discharge.  Patient would also like to have refills of her migraine medicine as opposed to calling every month.  She reports that she is doing well with rizatriptan and uses anywhere from 5-10 doses per month.  She has not had an office visit to discuss prevention occasion.  She would like to have an annual physical and intends on scheduling one soon.  Rhonda Green has a current medication list which includes the following prescription(s): gabapentin and rizatriptan. Patient has No Known Allergies.  Rhonda Green  has a past medical history of Anxiety, Depression, and GERD (gastroesophageal reflux disease). Also  has a past surgical history that includes Cesarean section; Abdominal hysterectomy; and Cholecystectomy.  Objective:   Vitals: BP 110/74   Pulse 85   Temp 98.5 F (36.9 C) (Oral)   Resp 16   Ht 5\' 9"  (1.753 m)   Wt 128 lb 9.6 oz (58.3 kg)   SpO2 99%   BMI 18.99 kg/m   Physical Exam  Constitutional: She is oriented to person, place, and time. She appears well-developed and well-nourished.  HENT:  Mouth/Throat: Oropharynx is clear and moist.  Eyes: Right eye exhibits no discharge. Left eye exhibits no discharge. No scleral icterus.  Cardiovascular: Normal rate, regular rhythm, normal heart sounds and intact distal pulses. Exam reveals no gallop and no friction rub.  No murmur heard. Pulmonary/Chest: Effort normal and breath sounds normal. No stridor. No respiratory distress. She has no wheezes. She has no rales.  Abdominal: Soft. Bowel sounds are normal. She exhibits no distension and no mass. There is no tenderness. There is no rebound and  no guarding.  No CVA tenderness.  Musculoskeletal: She exhibits no edema.  Neurological: She is alert and oriented to person, place, and time.  Skin: Skin is warm and dry. No rash noted. No erythema. No pallor.  Psychiatric: She has a normal mood and affect.    Results for orders placed or performed in visit on 01/24/18 (from the past 24 hour(s))  POCT urinalysis dipstick     Status: Abnormal   Collection Time: 01/24/18  5:19 PM  Result Value Ref Range   Color, UA yellow yellow   Clarity, UA cloudy (A) clear   Glucose, UA negative negative mg/dL   Bilirubin, UA negative negative   Ketones, POC UA trace (5) (A) negative mg/dL   Spec Grav, UA 4.0981.025 1.1911.010 - 1.025   Blood, UA small (A) negative   pH, UA 6.5 5.0 - 8.0   Protein Ur, POC negative negative mg/dL   Urobilinogen, UA 0.2 0.2 or 1.0 E.U./dL   Nitrite, UA Positive (A) Negative   Leukocytes, UA Trace (A) Negative    Assessment and Plan :   Cystitis  Dysuria - Plan: POCT urinalysis dipstick, Urine Microscopic, Urine Culture  Urinary frequency  Nausea and vomiting, intractability of vomiting not specified, unspecified vomiting type  Migraine with aura and without status migrainosus, not intractable - Plan: rizatriptan (MAXALT) 10 MG tablet  Based off of previous urine culture, will start Keflex.  Urine culture pending.  I refilled patient's rizatriptan, briefly discussed prevention medications for migraines.  Patient will come back  for follow-up and annual physical.  Return to clinic precautions reviewed.  Wallis Bamberg, PA-C [facility] [phone] 01/24/2018 5:19 PM

## 2018-01-24 NOTE — Patient Instructions (Addendum)
Urinary Tract Infection, Adult A urinary tract infection (UTI) is an infection of any part of the urinary tract. The urinary tract includes the:  Kidneys.  Ureters.  Bladder.  Urethra.  These organs make, store, and get rid of pee (urine) in the body. Follow these instructions at home:  Take over-the-counter and prescription medicines only as told by your doctor.  If you were prescribed an antibiotic medicine, take it as told by your doctor. Do not stop taking the antibiotic even if you start to feel better.  Avoid the following drinks: ? Alcohol. ? Caffeine. ? Tea. ? Carbonated drinks.  Drink enough fluid to keep your pee clear or pale yellow.  Keep all follow-up visits as told by your doctor. This is important.  Make sure to: ? Empty your bladder often and completely. Do not to hold pee for long periods of time. ? Empty your bladder before and after sex. ? Wipe from front to back after a bowel movement if you are female. Use each tissue one time when you wipe. Contact a doctor if:  You have back pain.  You have a fever.  You feel sick to your stomach (nauseous).  You throw up (vomit).  Your symptoms do not get better after 3 days.  Your symptoms go away and then come back. Get help right away if:  You have very bad back pain.  You have very bad lower belly (abdominal) pain.  You are throwing up and cannot keep down any medicines or water. This information is not intended to replace advice given to you by your health care provider. Make sure you discuss any questions you have with your health care provider. Document Released: 12/01/2007 Document Revised: 11/20/2015 Document Reviewed: 05/05/2015 Elsevier Interactive Patient Education  2018 Elsevier Inc.     IF you received an x-ray today, you will receive an invoice from [facility]. Please contact [facility] at [phone] with questions or concerns regarding your invoice.   IF you  received labwork today, you will receive an invoice from [facility]. Please contact [facility] at [phone] with questions or concerns regarding your invoice.   Our billing staff will not be able to assist you with questions regarding bills from these companies.  You will be contacted with the lab results as soon as they are available. The fastest way to get your results is to activate your My Chart account. Instructions are located on the last page of this paperwork. If you have not heard from us regarding the results in 2 weeks, please contact this office.      

## 2018-01-25 LAB — URINALYSIS, MICROSCOPIC ONLY: Casts: NONE SEEN /lpf

## 2018-01-27 LAB — URINE CULTURE

## 2018-07-11 ENCOUNTER — Encounter: Payer: Self-pay | Admitting: Family Medicine

## 2018-07-11 ENCOUNTER — Other Ambulatory Visit: Payer: Self-pay

## 2018-07-11 ENCOUNTER — Ambulatory Visit: Payer: BC Managed Care – PPO | Admitting: Family Medicine

## 2018-07-11 VITALS — BP 116/77 | HR 94 | Temp 98.7°F | Resp 17 | Ht 69.0 in | Wt 115.8 lb

## 2018-07-11 DIAGNOSIS — R399 Unspecified symptoms and signs involving the genitourinary system: Secondary | ICD-10-CM | POA: Diagnosis not present

## 2018-07-11 DIAGNOSIS — Z111 Encounter for screening for respiratory tuberculosis: Secondary | ICD-10-CM | POA: Diagnosis not present

## 2018-07-11 LAB — POCT URINALYSIS DIP (MANUAL ENTRY)
BILIRUBIN UA: NEGATIVE
BILIRUBIN UA: NEGATIVE mg/dL
Glucose, UA: NEGATIVE mg/dL
Nitrite, UA: NEGATIVE
Protein Ur, POC: NEGATIVE mg/dL
Spec Grav, UA: <=1.005 — AB (ref 1.010–1.025)
Urobilinogen, UA: 0.2 E.U./dL
pH, UA: 6 (ref 5.0–8.0)

## 2018-07-11 MED ORDER — PHENAZOPYRIDINE HCL 200 MG PO TABS: 200.0000 mg | ORAL_TABLET | Freq: Three times a day (TID) | ORAL | 0 refills | Status: DC | PRN

## 2018-07-11 NOTE — Patient Instructions (Signed)
° ° ° °  If you have lab work done today you will be contacted with your lab results within the next 2 weeks.  If you have not heard from us then please contact us. The fastest way to get your results is to register for My Chart. ° ° °IF you received an x-ray today, you will receive an invoice from [facility]. Please contact [facility] at [phone] with questions or concerns regarding your invoice.  ° °IF you received labwork today, you will receive an invoice from [facility]. Please contact [facility] at [phone] with questions or concerns regarding your invoice.  ° °Our billing staff will not be able to assist you with questions regarding bills from these companies. ° °You will be contacted with the lab results as soon as they are available. The fastest way to get your results is to activate your My Chart account. Instructions are located on the last page of this paperwork. If you have not heard from us regarding the results in 2 weeks, please contact this office. °  ° ° ° °

## 2018-07-11 NOTE — Progress Notes (Signed)
Established Patient Office Visit  Subjective:  Patient ID: Rhonda Green, female    DOB: Nov 08, 1970  Age: 48 y.o. MRN: [id number]  CC:  Chief Complaint  Patient presents with  . ? 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 Anxiety   . Depression   . GERD (gastroesophageal reflux disease)     Past Surgical History:  Procedure Laterality Date  . ABDOMINAL HYSTERECTOMY    . CESAREAN SECTION    . CHOLECYSTECTOMY      Family History  Problem Relation Age of Onset  . Diabetes Mother   . Hypertension Mother   . Anuerysm Father     Social History   Socioeconomic History  . Marital status: Legally Separated    Spouse name: Not on file  . Number of children: 2  . Years of education: Not on file  . Highest education level: Not on file  Occupational History  . Not on file  Social Needs  . Financial resource strain: Not on file  . Food insecurity:    Worry: Not on file    Inability: Not on file  . Transportation needs:    Medical: Not on file    Non-medical: Not on file  Tobacco Use  . Smoking status: Never Smoker  . Smokeless tobacco: Never Used  Substance and Sexual Activity  . Alcohol use: No    Alcohol/week: 0.0 standard drinks  . Drug use: No  . Sexual activity: Yes  Lifestyle  . Physical activity:    Days per week: Not on file    Minutes per session: Not on file  . Stress: Not on file  Relationships  . Social connections:    Talks on phone: Not on file    Gets together:  Not on file    Attends religious service: Not on file    Active member of club or organization: Not on file    Attends meetings of clubs or organizations: Not on file    Relationship status: Not on file  . Intimate partner violence:    Fear of current or ex partner: Not on file    Emotionally abused: Not on file    Physically abused: Not on file    Forced sexual activity: Not on file  Other Topics Concern  . Not on file  Social History Narrative  . Not on file    Outpatient Medications Prior to Visit  Medication Sig Dispense Refill  . gabapentin (NEURONTIN) 300 MG capsule 1 capsule in morning and afternoon, 2 capsules at night as needed for anxiety. 120 capsule 5  . rizatriptan (MAXALT) 10 MG tablet TAKE 1 TABLET BY MOUTH IMMEDIATELY MAY REPEAT AFTER 2 HOURS. DO NOT REPEAT UNTIL THE NEXT DAY. 10 tablet 5  . cephALEXin (KEFLEX) 500 MG capsule Take 1 capsule (500 mg total) by mouth 2 (two) times daily. 14 capsule 0   No facility-administered medications prior to visit.  No Known Allergies  ROS Review of Systems See hpi   Objective:    Physical Exam  BP 116/77 (BP Location: Right Arm, Patient Position: Sitting, Cuff Size: Normal)   Pulse 94   Temp 98.7 F (37.1 C) (Oral)   Resp 17   Ht 5\' 9"  (1.753 m)   Wt 115 lb 12.8 oz (52.5 kg)   SpO2 99%   BMI 17.10 kg/m  Wt Readings from Last 3 Encounters:  07/11/18 115 lb 12.8 oz (52.5 kg)  01/24/18 128 lb 9.6 oz (58.3 kg)  08/10/17 137 lb 12.8 oz (62.5 kg)   Physical Exam  Constitutional: She is oriented to person, place, and time. She appears well-developed and well-nourished.  HENT:  Head: Normocephalic and atraumatic.  Eyes: Conjunctivae and EOM are normal.  Cardiovascular: Normal rate, regular rhythm and normal heart sounds.   Pulmonary/Chest: Effort normal and breath sounds normal. No respiratory distress. She has no wheezes.  Abdominal: Normal appearance and bowel sounds are normal. There is no tenderness. There  is no CVA tenderness.  Neurological: She is alert and oriented to person, place, and time.     Health Maintenance Due  Topic Date Due  . TETANUS/TDAP  08/08/1991  . PAP SMEAR-Modifier  08/07/1993  . INFLUENZA VACCINE  01/26/2018      Assessment & Plan:   Problem List Items Addressed This Visit    None    Visit Diagnoses    UTI symptoms    -  Primary   Relevant Orders   POCT urinalysis dipstick (Completed)   Urine Culture   Need for Tdap vaccination       Screening-pulmonary TB       Relevant Orders   TB Skin Test     Discussed treatment with pyridium  Continue hydration  Will send urine culture Will treat based on culture results   Meds ordered this encounter  Medications  . phenazopyridine (PYRIDIUM) 200 MG tablet    Sig: Take 1 tablet (200 mg total) by mouth 3 (three) times daily as needed for pain.    Dispense:  10 tablet    Refill:  0    Follow-up: No follow-ups on file.    Doristine BosworthZoe A Bradlee Bridgers, MD

## 2018-07-13 LAB — URINE CULTURE

## 2018-07-15 ENCOUNTER — Telehealth: Payer: Self-pay | Admitting: Family Medicine

## 2018-07-15 NOTE — Telephone Encounter (Signed)
Copied from CRM [id number]. Topic: General - Other >> Jul 14, 2018  4:53 PM Jilda Roche wrote: Reason for CRM: Patient is calling about her lab results, please advise  Best call back is [phone]

## 2018-07-17 NOTE — Telephone Encounter (Signed)
Pt. Calling in regard to Urine culture results. Still having symptoms - urgency,frequency and body aches. Please advise pt.

## 2018-07-18 ENCOUNTER — Other Ambulatory Visit: Payer: Self-pay | Admitting: *Deleted

## 2018-07-18 ENCOUNTER — Encounter: Payer: Self-pay | Admitting: Family Medicine

## 2018-07-18 DIAGNOSIS — R399 Unspecified symptoms and signs involving the genitourinary system: Secondary | ICD-10-CM

## 2018-07-18 MED ORDER — SULFAMETHOXAZOLE-TRIMETHOPRIM 800-160 MG PO TABS: 1.0000 | ORAL_TABLET | Freq: Two times a day (BID) | ORAL | 0 refills | Status: DC

## 2018-07-25 ENCOUNTER — Other Ambulatory Visit: Payer: Self-pay | Admitting: *Deleted

## 2018-07-25 DIAGNOSIS — R399 Unspecified symptoms and signs involving the genitourinary system: Secondary | ICD-10-CM

## 2018-07-25 MED ORDER — CEPHALEXIN 500 MG PO CAPS: 500.0000 mg | ORAL_CAPSULE | Freq: Two times a day (BID) | ORAL | 0 refills | Status: DC

## 2018-12-09 IMAGING — CR DG CHEST 2V
2 series · 2 of 2 positions shown · non-contrast
Comparison: 09/02/2015

CLINICAL DATA: Mid chest pain.  Hematemesis.

EXAM:
CHEST  2 VIEW

[chest pa]
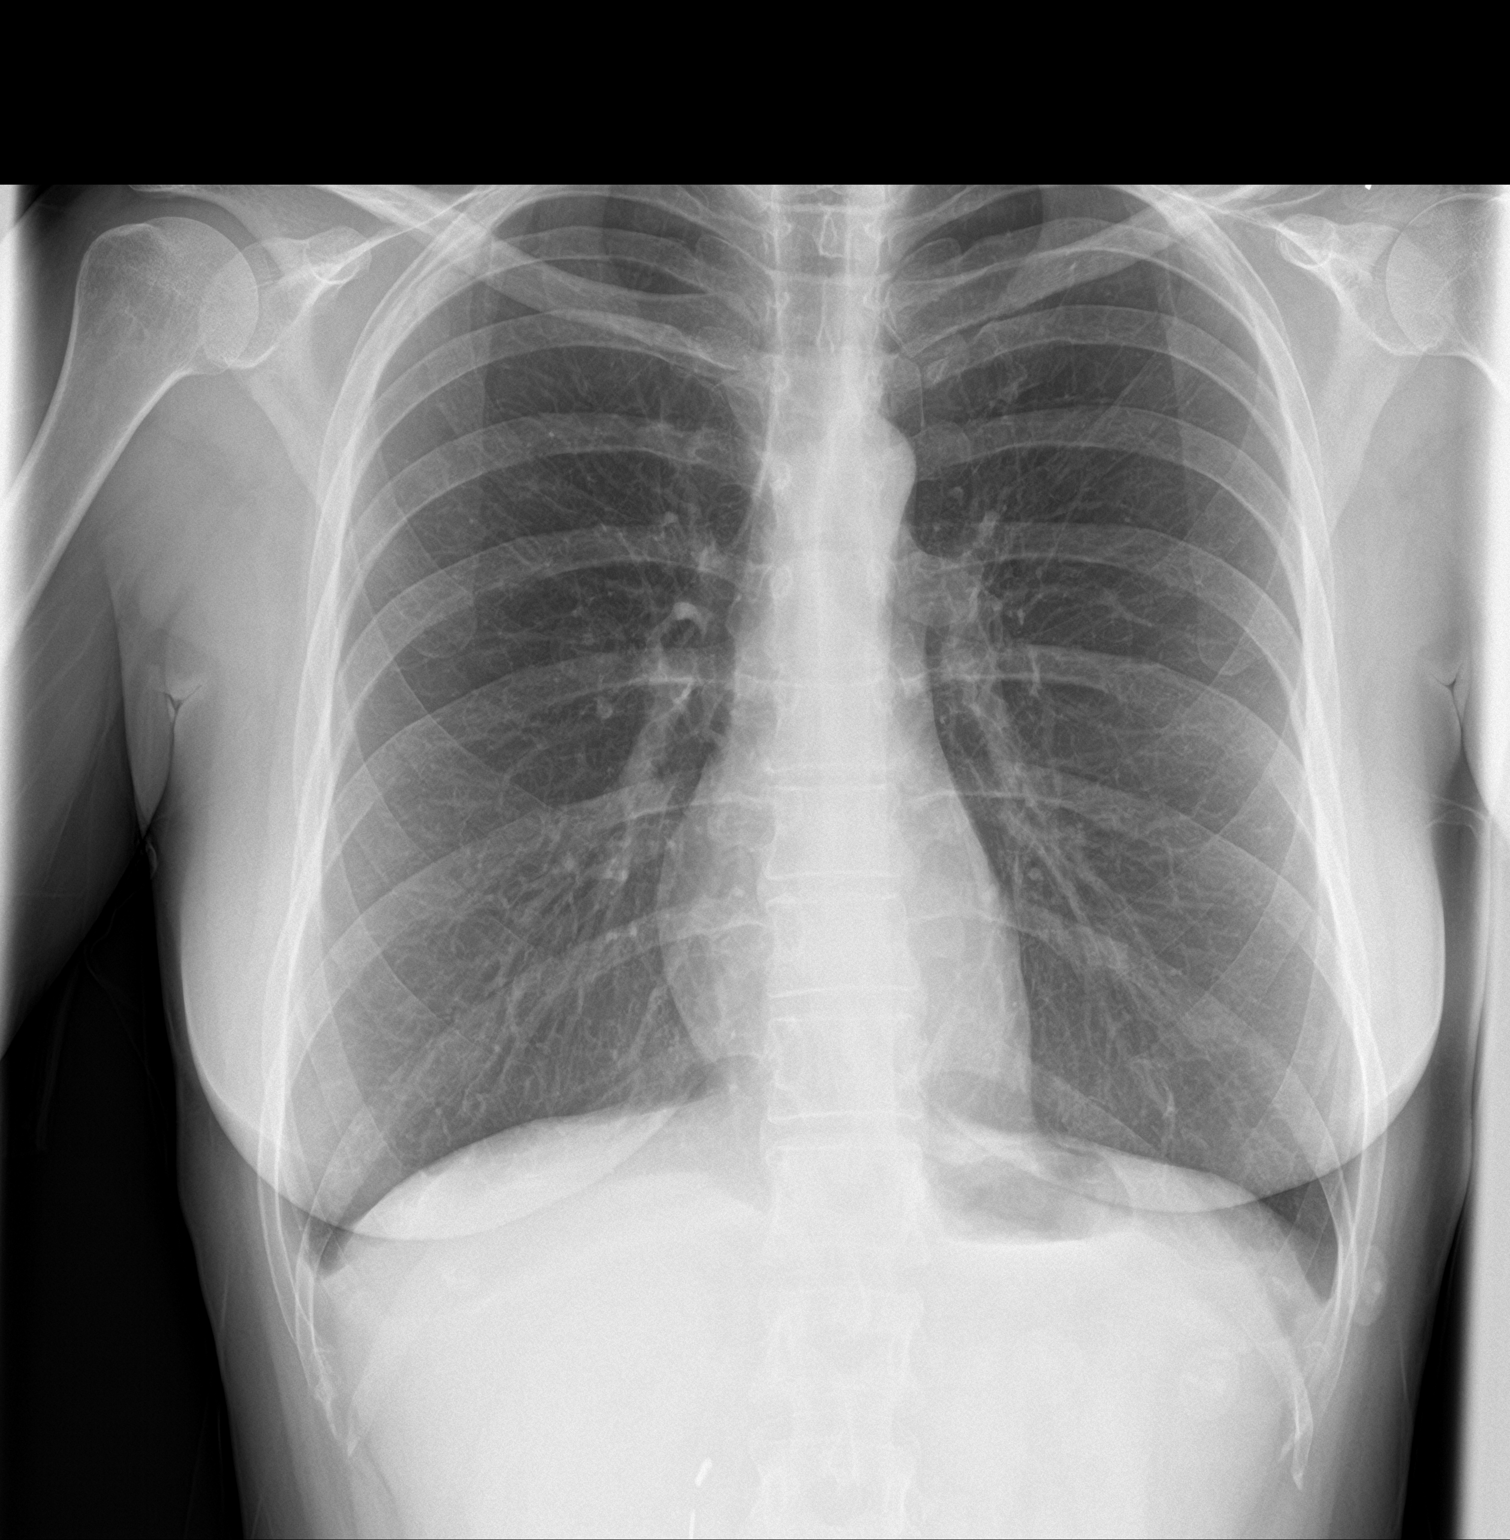

[chest lat]
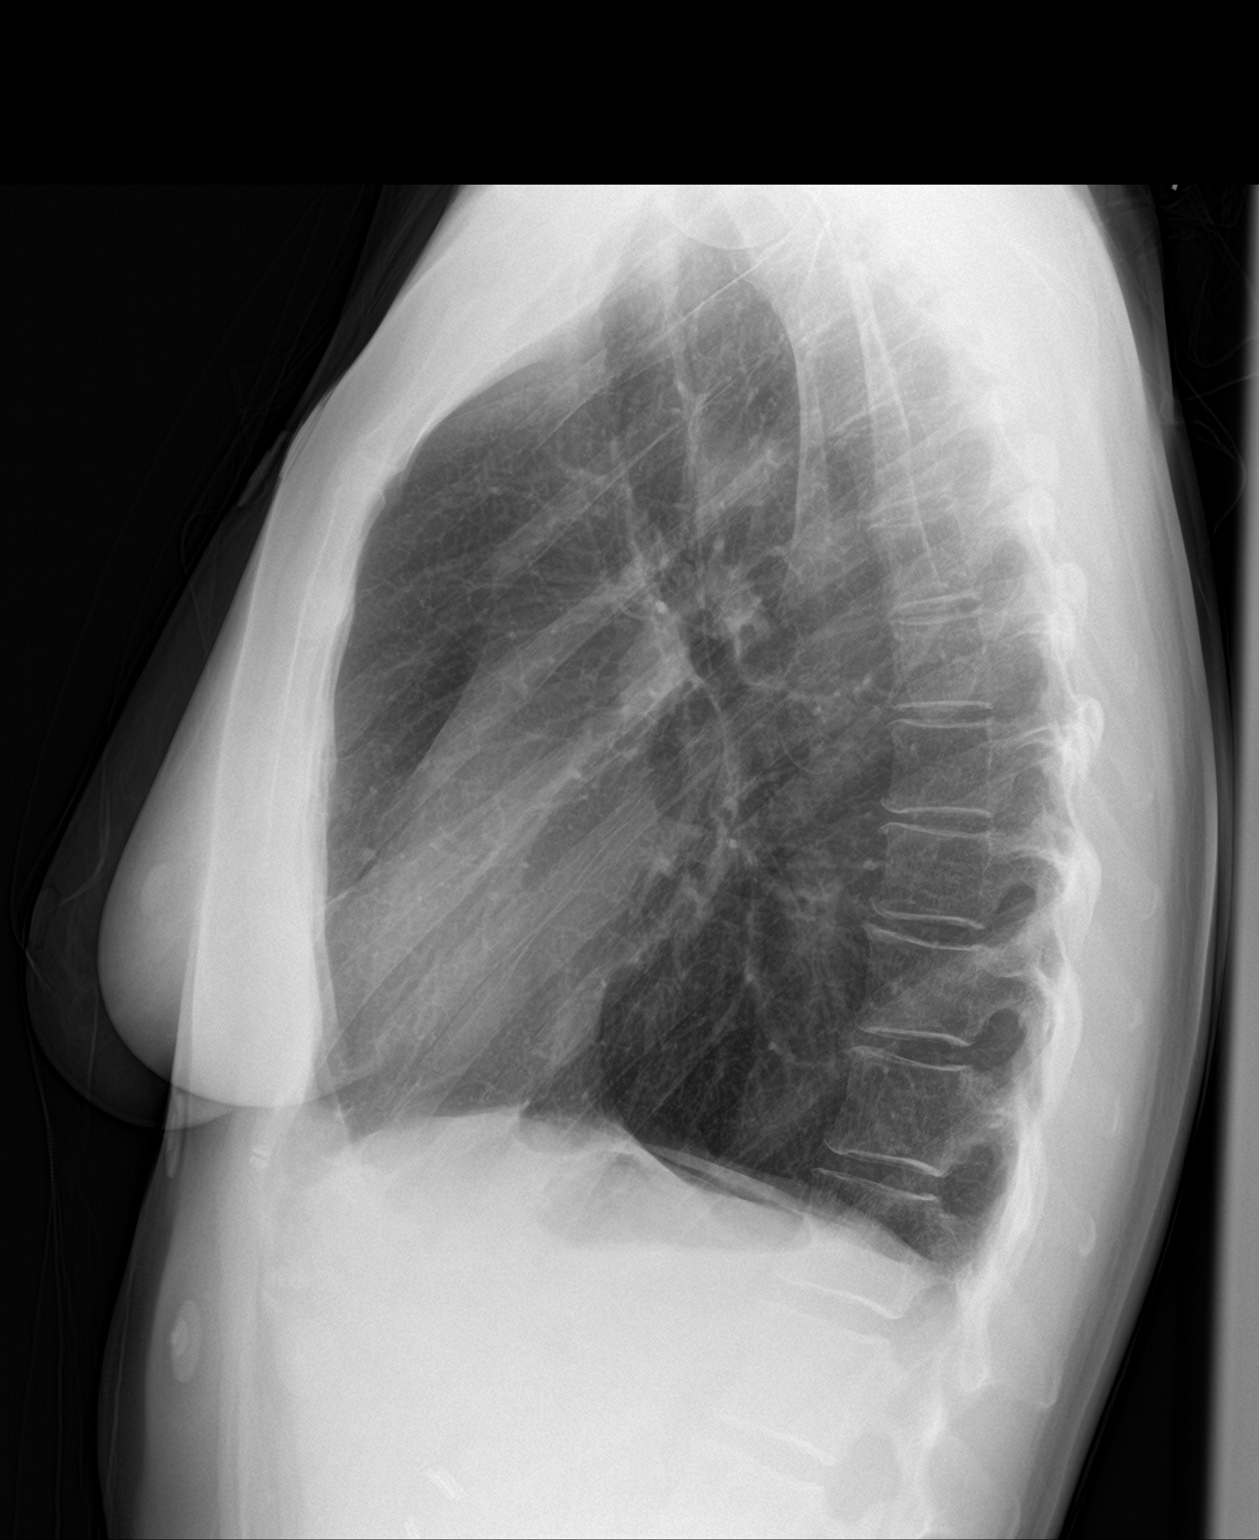

[2 of 2 positions shown; findings below may reference images not displayed]

FINDINGS: The cardiomediastinal contours are normal. No pneumomediastinum. The
lungs are clear. Pulmonary vasculature is normal. No consolidation,
pleural effusion, or pneumothorax. No acute osseous abnormalities
are seen.
IMPRESSION: No active cardiopulmonary disease.

## 2019-01-02 ENCOUNTER — Other Ambulatory Visit: Payer: Self-pay | Admitting: Family Medicine

## 2019-01-02 DIAGNOSIS — G43109 Migraine with aura, not intractable, without status migrainosus: Secondary | ICD-10-CM

## 2019-01-02 NOTE — Telephone Encounter (Signed)
REFILL rizatriptan (MAXALT) 10 MG tablet  [facility], [place] [address] [phone] PHONE

## 2019-01-12 NOTE — Telephone Encounter (Signed)
Patient calling in checking on status for medication she requested over a week ago. Please advise.

## 2019-01-16 MED ORDER — RIZATRIPTAN BENZOATE 10 MG PO TABS: ORAL_TABLET | ORAL | 5 refills | Status: DC

## 2019-01-16 NOTE — Addendum Note (Signed)
Addended by: Delia Chimes A on: 01/16/2019 08:28 PM   Modules accepted: Orders

## 2019-01-16 NOTE — Telephone Encounter (Signed)
Please notify the patient that the refill was sent in

## 2019-05-28 ENCOUNTER — Telehealth (INDEPENDENT_AMBULATORY_CARE_PROVIDER_SITE_OTHER): Payer: BC Managed Care – PPO | Admitting: Family Medicine

## 2019-05-28 ENCOUNTER — Encounter: Payer: Self-pay | Admitting: Family Medicine

## 2019-05-28 ENCOUNTER — Other Ambulatory Visit: Payer: Self-pay

## 2019-05-28 VITALS — Ht 69.0 in | Wt 130.0 lb

## 2019-05-28 DIAGNOSIS — F411 Generalized anxiety disorder: Secondary | ICD-10-CM

## 2019-05-28 DIAGNOSIS — Z5181 Encounter for therapeutic drug level monitoring: Secondary | ICD-10-CM

## 2019-05-28 MED ORDER — GABAPENTIN 300 MG PO CAPS: ORAL_CAPSULE | ORAL | 3 refills | Status: DC

## 2019-05-28 NOTE — Progress Notes (Signed)
Telemedicine Encounter- SOAP NOTE Established Patient  This telephone encounter was conducted with the patient's (or proxy's) verbal consent via audio telecommunications: yes/no: Yes Patient was instructed to have this encounter in a suitably private space; and to only have persons present to whom they give permission to participate. In addition, patient identity was confirmed by use of name plus two identifiers (DOB and address).  I discussed the limitations, risks, security and privacy concerns of performing an evaluation and management service by telephone and the availability of in person appointments. I also discussed with the patient that there may be a patient responsible charge related to this service. The patient expressed understanding and agreed to proceed.  I spent a total of TIME; 0 MIN TO 60 MIN: 15 minutes talking with the patient or their proxy.  CC: gabapentin, anxiety   Subjective   Rhonda Green is a 48 y.o. established patient. Telephone visit today for  HPI  She takes gabapentin 300mg  in the morning and 300mg  at night. Rarely she takes an extra dose.  She denies any issues with the medication. It makes her sleepy sometimes.  No blurry vision or double vision.    It helps her anxiety but not her headache.   She reports that her anxiety has been tolerable up until covid which increased her anxiety  She denies panic attacks    Patient Active Problem List   Diagnosis Date Noted  . Anxiety 02/05/2015    Past Medical History:  Diagnosis Date  . Anxiety   . Depression   . GERD (gastroesophageal reflux disease)     Current Outpatient Medications  Medication Sig Dispense Refill  . gabapentin (NEURONTIN) 300 MG capsule 1 capsule in morning and bedtime, 1 capsules in the afternoon as needed for anxiety. 180 capsule 3  . OMEPRAZOLE PO Take 20 mg by mouth daily.    . rizatriptan (MAXALT) 10 MG tablet TAKE 1 TABLET BY MOUTH IMMEDIATELY MAY REPEAT AFTER 2 HOURS. DO  NOT REPEAT UNTIL THE NEXT DAY. 10 tablet 5  . phenazopyridine (PYRIDIUM) 200 MG tablet Take 1 tablet (200 mg total) by mouth 3 (three) times daily as needed for pain. (Patient not taking: Reported on 05/28/2019) 10 tablet 0   No current facility-administered medications for this visit.     No Known Allergies  Social History   Socioeconomic History  . Marital status: Legally Separated    Spouse name: Not on file  . Number of children: 2  . Years of education: Not on file  . Highest education level: Not on file  Occupational History  . Not on file  Social Needs  . Financial resource strain: Not on file  . Food insecurity    Worry: Not on file    Inability: Not on file  . Transportation needs    Medical: Not on file    Non-medical: Not on file  Tobacco Use  . Smoking status: Never Smoker  . Smokeless tobacco: Never Used  Substance and Sexual Activity  . Alcohol use: No    Alcohol/week: 0.0 standard drinks  . Drug use: No  . Sexual activity: Yes  Lifestyle  . Physical activity    Days per week: Not on file    Minutes per session: Not on file  . Stress: Not on file  Relationships  . Social Herbalist on phone: Not on file    Gets together: Not on file    Attends religious service: Not on  file    Active member of club or organization: Not on file    Attends meetings of clubs or organizations: Not on file    Relationship status: Not on file  . Intimate partner violence    Fear of current or ex partner: Not on file    Emotionally abused: Not on file    Physically abused: Not on file    Forced sexual activity: Not on file  Other Topics Concern  . Not on file  Social History Narrative  . Not on file    ROS Review of Systems  Constitutional: Negative for activity change, appetite change, chills and fever.  HENT: Negative for congestion, nosebleeds, trouble swallowing and voice change.   Respiratory: Negative for cough, shortness of breath and wheezing.    Gastrointestinal: Negative for diarrhea, nausea and vomiting.  Genitourinary: Negative for difficulty urinating, dysuria, flank pain and hematuria.  Musculoskeletal: Negative for back pain, joint swelling and neck pain.  Neurological: Negative for dizziness, speech difficulty, light-headedness and numbness.  See HPI. All other review of systems negative.   Objective   Vitals as reported by the patient: Today's Vitals   05/28/19 1413  Weight: 130 lb (59 kg)  Height: 5\' 9"  (1.753 m)    Diagnoses and all orders for this visit:  Encounter for medication monitoring -     Basic metabolic panel; Future  GAD (generalized anxiety disorder)  Other orders -     gabapentin (NEURONTIN) 300 MG capsule; 1 capsule in morning and bedtime, 1 capsules in the afternoon as needed for anxiety.   Discussed that her sugars should be monitored and ordered a BMP Pt med refilled Continue current meds   I discussed the assessment and treatment plan with the patient. The patient was provided an opportunity to ask questions and all were answered. The patient agreed with the plan and demonstrated an understanding of the instructions.   The patient was advised to call back or seek an in-person evaluation if the symptoms worsen or if the condition fails to improve as anticipated.  I provided 15 minutes of non-face-to-face time during this encounter.  , MD  Primary [facility] at [facility]

## 2019-05-28 NOTE — Progress Notes (Signed)
Med refill   Gabapentin  

## 2019-05-28 NOTE — Patient Instructions (Signed)
° ° ° °  If you have lab work done today you will be contacted with your lab results within the next 2 weeks.  If you have not heard from us then please contact us. The fastest way to get your results is to register for My Chart. ° ° °IF you received an x-ray today, you will receive an invoice from [facility]. Please contact [facility] at [phone] with questions or concerns regarding your invoice.  ° °IF you received labwork today, you will receive an invoice from [facility]. Please contact [facility] at [phone] with questions or concerns regarding your invoice.  ° °Our billing staff will not be able to assist you with questions regarding bills from these companies. ° °You will be contacted with the lab results as soon as they are available. The fastest way to get your results is to activate your My Chart account. Instructions are located on the last page of this paperwork. If you have not heard from us regarding the results in 2 weeks, please contact this office. °  ° ° ° °

## 2019-08-31 ENCOUNTER — Encounter: Payer: Self-pay | Admitting: Family Medicine

## 2019-09-03 ENCOUNTER — Other Ambulatory Visit: Payer: Self-pay | Admitting: Family Medicine

## 2019-09-04 ENCOUNTER — Ambulatory Visit: Payer: BC Managed Care – PPO | Admitting: Family Medicine

## 2019-09-04 ENCOUNTER — Encounter: Payer: Self-pay | Admitting: Family Medicine

## 2019-09-04 ENCOUNTER — Other Ambulatory Visit: Payer: Self-pay

## 2019-09-04 VITALS — BP 120/77 | HR 101 | Temp 98.3°F | Resp 16 | Ht 69.0 in | Wt 116.0 lb

## 2019-09-04 DIAGNOSIS — R634 Abnormal weight loss: Secondary | ICD-10-CM | POA: Diagnosis not present

## 2019-09-04 DIAGNOSIS — Z136 Encounter for screening for cardiovascular disorders: Secondary | ICD-10-CM | POA: Diagnosis not present

## 2019-09-04 DIAGNOSIS — R636 Underweight: Secondary | ICD-10-CM | POA: Diagnosis not present

## 2019-09-04 DIAGNOSIS — Z8262 Family history of osteoporosis: Secondary | ICD-10-CM

## 2019-09-04 DIAGNOSIS — F411 Generalized anxiety disorder: Secondary | ICD-10-CM

## 2019-09-04 DIAGNOSIS — Z0001 Encounter for general adult medical examination with abnormal findings: Secondary | ICD-10-CM | POA: Diagnosis not present

## 2019-09-04 DIAGNOSIS — N644 Mastodynia: Secondary | ICD-10-CM

## 2019-09-04 DIAGNOSIS — Z90710 Acquired absence of both cervix and uterus: Secondary | ICD-10-CM

## 2019-09-04 DIAGNOSIS — Z13820 Encounter for screening for osteoporosis: Secondary | ICD-10-CM

## 2019-09-04 DIAGNOSIS — R6889 Other general symptoms and signs: Secondary | ICD-10-CM

## 2019-09-04 DIAGNOSIS — Z13220 Encounter for screening for lipoid disorders: Secondary | ICD-10-CM

## 2019-09-04 DIAGNOSIS — Z0000 Encounter for general adult medical examination without abnormal findings: Secondary | ICD-10-CM

## 2019-09-04 DIAGNOSIS — Z803 Family history of malignant neoplasm of breast: Secondary | ICD-10-CM

## 2019-09-04 NOTE — Progress Notes (Signed)
Chief Complaint  Patient presents with  . Referral    pt would like referral for mammogram do to tingling at [place], and shooting pains throughout breasts, x 2 months     Subjective:  Rhonda Green is a 49 y.o. female here for a health maintenance visit.  Patient is established pt.  She asked for a physical exam but also had additional concerns that required an office visit.  Patient reports that for 2 months she has noticed a tingling sensation under the left nipple and a tingling sensation throughout the left breast Her mother had breast cancer at age 79 She has never had a mammogram  Nonsmoker G2P2 S/P Hysterectomy without oophorectomy  Underweight Pt reports that she continues to have poor appetite which she has been evaluated for in the past Wt Readings from Last 3 Encounters:  09/04/19 116 lb (52.6 kg)  05/28/19 130 lb (59 kg)  07/11/18 115 lb 12.8 oz (52.5 kg)  BMI 17.1  Endocrine ROS: She reports that she has cold intolerance Denies hair loss No sleep disturbances No diarrhea or constipation No hot flashes  Anxiety She reports that she has anxiety and depression  She states that she just feels overwhelmed by anxiety and feels like she has this knot that she feels in her throat. She denies obvious panic attacks  GAD 7 : Generalized Anxiety Score 09/04/2019  Nervous, Anxious, on Edge 3  Control/stop worrying 3  Worry too much - different things 3  Trouble relaxing 3  Restless 1  Easily annoyed or irritable 1  Afraid - awful might happen 3  Total GAD 7 Score 17  Anxiety Difficulty Very difficult     Depression screen [facility] 2/9 09/04/2019 07/11/2018 08/10/2017 05/06/2017 08/04/2016  Decreased Interest 0 0 0 0 0  Down, Depressed, Hopeless 1 0 0 0 0  PHQ - 2 Score 1 0 0 0 0  Altered sleeping - - - - -  Tired, decreased energy - - - - -  Change in appetite - - - - -  Feeling bad or failure about yourself  - - - - -  Trouble concentrating - - - - -  Moving slowly or  fidgety/restless - - - - -  Suicidal thoughts - - - - -  PHQ-9 Score - - - - -     Patient Active Problem List   Diagnosis Date Noted  . Anxiety 02/05/2015    Past Medical History:  Diagnosis Date  . Anxiety   . Depression   . GERD (gastroesophageal reflux disease)     Past Surgical History:  Procedure Laterality Date  . ABDOMINAL HYSTERECTOMY    . CESAREAN SECTION    . CHOLECYSTECTOMY       Outpatient Medications Prior to Visit  Medication Sig Dispense Refill  . gabapentin (NEURONTIN) 300 MG capsule 1 capsule in morning and bedtime, 1 capsules in the afternoon as needed for anxiety. 180 capsule 3  . OMEPRAZOLE PO Take 20 mg by mouth daily.    . rizatriptan (MAXALT) 10 MG tablet TAKE 1 TABLET BY MOUTH IMMEDIATELY MAY REPEAT AFTER 2 HOURS. DO NOT REPEAT UNTIL THE NEXT DAY. 10 tablet 5  . phenazopyridine (PYRIDIUM) 200 MG tablet Take 1 tablet (200 mg total) by mouth 3 (three) times daily as needed for pain. (Patient not taking: Reported on 05/28/2019) 10 tablet 0   No facility-administered medications prior to visit.    Allergies  Allergen Reactions  . Morphine And Related Nausea And  Vomiting     Family History  Problem Relation Age of Onset  . Diabetes Mother   . Hypertension Mother   . Anuerysm Father      Health Habits: Dental Exam: up to date Eye Exam: up to date Diet: poor appetite, but eats a variety of foods  Social History   Socioeconomic History  . Marital status: Legally Separated    Spouse name: Not on file  . Number of children: 2  . Years of education: Not on file  . Highest education level: Not on file  Occupational History  . Not on file  Tobacco Use  . Smoking status: Never Smoker  . Smokeless tobacco: Never Used  Substance and Sexual Activity  . Alcohol use: No    Alcohol/week: 0.0 standard drinks  . Drug use: No  . Sexual activity: Yes  Other Topics Concern  . Not on file  Social History Narrative  . Not on file   Social  Determinants of Health   Financial Resource Strain:   . Difficulty of Paying Living Expenses: Not on file  Food Insecurity:   . Worried About Charity fundraiser in the Last Year: Not on file  . Ran Out of Food in the Last Year: Not on file  Transportation Needs:   . Lack of Transportation (Medical): Not on file  . Lack of Transportation (Non-Medical): Not on file  Physical Activity:   . Days of Exercise per Week: Not on file  . Minutes of Exercise per Session: Not on file  Stress:   . Feeling of Stress : Not on file  Social Connections:   . Frequency of Communication with Friends and Family: Not on file  . Frequency of Social Gatherings with Friends and Family: Not on file  . Attends Religious Services: Not on file  . Active Member of Clubs or Organizations: Not on file  . Attends Archivist Meetings: Not on file  . Marital Status: Not on file  Intimate Partner Violence:   . Fear of Current or Ex-Partner: Not on file  . Emotionally Abused: Not on file  . Physically Abused: Not on file  . Sexually Abused: Not on file   Social History   Substance and Sexual Activity  Alcohol Use No  . Alcohol/week: 0.0 standard drinks   Social History   Tobacco Use  Smoking Status Never Smoker  Smokeless Tobacco Never Used   Social History   Substance and Sexual Activity  Drug Use No    GYN: Sexual Health Menstrual status: regular menses LMP: No LMP recorded. Patient has had a hysterectomy. Last pap smear: see HM section History of abnormal pap smears:   Health Maintenance: See under health Maintenance activity for review of completion dates as well. There is no immunization history for the selected administration types on file for this patient.    Depression Screen-PHQ2/9 Depression screen [facility] 2/9 09/04/2019 07/11/2018 08/10/2017 05/06/2017 08/04/2016  Decreased Interest 0 0 0 0 0  Down, Depressed, Hopeless 1 0 0 0 0  PHQ - 2 Score 1 0 0 0 0  Altered sleeping - - - -  -  Tired, decreased energy - - - - -  Change in appetite - - - - -  Feeling bad or failure about yourself  - - - - -  Trouble concentrating - - - - -  Moving slowly or fidgety/restless - - - - -  Suicidal thoughts - - - - -  PHQ-9  Score - - - - -       Depression Severity and Treatment Recommendations:  0-4= None  5-9= Mild / Treatment: Support, educate to call if worse; return in one month  10-14= Moderate / Treatment: Support, watchful waiting; Antidepressant or Psycotherapy  15-19= Moderately severe / Treatment: Antidepressant OR Psychotherapy  >= 20 = Major depression, severe / Antidepressant AND Psychotherapy    Review of Systems   ROS  See HPI for ROS as well.  Review of Systems  Constitutional: Negative for activity change, appetite change, chills and fever.  HENT: Negative for congestion, nosebleeds, trouble swallowing and voice change.   Respiratory: Negative for cough, shortness of breath and wheezing.   Gastrointestinal: Negative for diarrhea, nausea and vomiting.  Genitourinary: Negative for difficulty urinating, dysuria, flank pain and hematuria.  Musculoskeletal: Negative for back pain, joint swelling and neck pain.  Neurological: Negative for dizziness, speech difficulty, light-headedness and numbness.  See HPI. All other review of systems negative.    Objective:   Vitals:   09/04/19 0853  BP: 120/77  Pulse: (!) 101  Resp: 16  Temp: 98.3 F (36.8 C)  TempSrc: Temporal  SpO2: 98%  Weight: 116 lb (52.6 kg)  Height: '5\' 9"'  (1.753 m)    Body mass index is 17.13 kg/m.  Physical Exam Constitutional:      Comments: thin appearing female  HENT:     Head: Normocephalic and atraumatic.     Right Ear: Tympanic membrane normal.     Left Ear: Tympanic membrane normal.  Eyes:     Extraocular Movements: Extraocular movements intact.     Conjunctiva/sclera: Conjunctivae normal.  Neck:     Comments: No thyromegaly Cardiovascular:     Rate and Rhythm:  Normal rate and regular rhythm.     Pulses: Normal pulses.     Heart sounds: No murmur.  Pulmonary:     Effort: Pulmonary effort is normal. No respiratory distress.     Breath sounds: Normal breath sounds. No stridor. No wheezing, rhonchi or rales.  Chest:     Chest wall: No tenderness.  Abdominal:     General: Abdomen is scaphoid. Bowel sounds are normal. There is no abdominal bruit.     Palpations: There is no pulsatile mass.     Tenderness: There is no abdominal tenderness. There is no right CVA tenderness, left CVA tenderness, guarding or rebound. Negative signs include Murphy's sign, Rovsing's sign, McBurney's sign, psoas sign and obturator sign.  Musculoskeletal:        General: No swelling. Normal range of motion.     Cervical back: Normal range of motion and neck supple.  Skin:    General: Skin is warm.     Coloration: Skin is not jaundiced or pale.     Findings: No bruising or erythema.  Neurological:     General: No focal deficit present.     Mental Status: She is alert and oriented to person, place, and time.  Psychiatric:        Mood and Affect: Mood normal.        Behavior: Behavior normal.        Thought Content: Thought content normal.        Judgment: Judgment normal.   Breast exam: no axillary or supraclavicular LAD, no nipple discharge, no lymphedema, normal skin, no masses     Assessment/Plan:   Patient was seen for a health maintenance exam.  Counseled the patient on health maintenance issues. Reviewed her health  mainteance schedule and ordered appropriate tests (see orders.) Counseled on regular exercise and weight management. Recommend regular eye exams and dental cleaning.   The following issues were addressed today for health maintenance:   Maudene was seen today for referral.  Diagnoses and all orders for this visit:  Encounter for health maintenance examination in adult-  Women's Health Maintenance Plan Advised monthly breast exam and annual  mammogram Advised dental exam every six months Discussed stress management Discussed that due to hysterectomy pap smears are not indidcated   Unintentional weight loss- will review labs, deficiency -     CBC -     CMP14+EGFR -     TSH + free T4 -     FSH/LH -     VITAMIN D 25 Hydroxy (Vit-D Deficiency, Fractures)  Underweight -     CBC -     CMP14+EGFR -     TSH + free T4 -     FSH/LH -     VITAMIN D 25 Hydroxy (Vit-D Deficiency, Fractures)  Cold intolerance -     CBC -     TSH + free T4 -     FSH/LH -     VITAMIN D 25 Hydroxy (Vit-D Deficiency, Fractures)  Generalized anxiety disorder- will monitor labs then set up a virtual visit to discuss results -     CBC -     TSH + free T4  Screening for osteoporosis-  Will screen for osteoporosis due to family history and BMI -     DG Bone Density; Future  Breast pain, left -     MM Digital Diagnostic Bilat; Future  Family history of osteoporosis in mother -     DG Bone Density; Future  Family history of breast cancer in mother -     MM Digital Diagnostic Bilat; Future  S/P hysterectomy  Encounter for lipid screening for cardiovascular disease -     Lipid panel    No follow-ups on file.    Body mass index is 17.13 kg/m.:  Discussed the patient's BMI with patient. The BMI body mass index is 17.13 kg/m.     No future appointments.  Patient Instructions       If you have lab work done today you will be contacted with your lab results within the next 2 weeks.  If you have not heard from [place] then please contact us. The fastest way to get your results is to register for My Chart.   IF you received an x-ray today, you will receive an invoice from [facility]. Please contact [facility] at [phone] with questions or concerns regarding your invoice.   IF you received labwork today, you will receive an invoice from [place]. Please contact [facility] at [phone] with questions or concerns  regarding your invoice.   Our billing staff will not be able to assist you with questions regarding bills from these companies.  You will be contacted with the lab results as soon as they are available. The fastest way to get your results is to activate your My Chart account. Instructions are located on the last page of this paperwork. If you have not heard from [place] regarding the results in 2 weeks, please contact this office.

## 2019-09-04 NOTE — Patient Instructions (Addendum)
Please make a visit for a virtual visit to discuss labs and anxiety in 2 weeks.    If you have lab work done today you will be contacted with your lab results within the next 2 weeks.  If you have not heard from [place] then please contact us. The fastest way to get your results is to register for My Chart.   IF you received an x-ray today, you will receive an invoice from [facility]. Please contact [facility] at [phone] with questions or concerns regarding your invoice.   IF you received labwork today, you will receive an invoice from [place]. Please contact [facility] at [phone] with questions or concerns regarding your invoice.   Our billing staff will not be able to assist you with questions regarding bills from these companies.  You will be contacted with the lab results as soon as they are available. The fastest way to get your results is to activate your My Chart account. Instructions are located on the last page of this paperwork. If you have not heard from [place] regarding the results in 2 weeks, please contact this office.     Health Maintenance, Female Adopting a healthy lifestyle and getting preventive care are important in promoting health and wellness. Ask your health care provider about:  The right schedule for you to have regular tests and exams.  Things you can do on your own to prevent diseases and keep yourself healthy. What should I know about diet, weight, and exercise? Eat a healthy diet   Eat a diet that includes plenty of vegetables, fruits, low-fat dairy products, and lean protein.  Do not eat a lot of foods that are high in solid fats, added sugars, or sodium. Maintain a healthy weight Body mass index (BMI) is used to identify weight problems. It estimates body fat based on height and weight. Your health care provider can help determine your BMI and help you achieve or maintain a healthy weight. Get regular exercise Get regular exercise.  This is one of the most important things you can do for your health. Most adults should:  Exercise for at least 150 minutes each week. The exercise should increase your heart rate and make you sweat (moderate-intensity exercise).  Do strengthening exercises at least twice a week. This is in addition to the moderate-intensity exercise.  Spend less time sitting. Even light physical activity can be beneficial. Watch cholesterol and blood lipids Have your blood tested for lipids and cholesterol at 47 years of age, then have this test every 5 years. Have your cholesterol levels checked more often if:  Your lipid or cholesterol levels are high.  You are older than 47 years of age.  You are at high risk for heart disease. What should I know about cancer screening? Depending on your health history and family history, you may need to have cancer screening at various ages. This may include screening for:  Breast cancer.  Cervical cancer.  Colorectal cancer.  Skin cancer.  Lung cancer. What should I know about heart disease, diabetes, and high blood pressure? Blood pressure and heart disease  High blood pressure causes heart disease and increases the risk of stroke. This is more likely to develop in people who have high blood pressure readings, are of African descent, or are overweight.  Have your blood pressure checked: ? Every 3-5 years if you are 20-3 years of age. ? Every year if you are 88 years old or older. Diabetes Have regular diabetes  screenings. This checks your fasting blood sugar level. Have the screening done:  Once every three years after age 64 if you are at a normal weight and have a low risk for diabetes.  More often and at a younger age if you are overweight or have a high risk for diabetes. What should I know about preventing infection? Hepatitis B If you have a higher risk for hepatitis B, you should be screened for this virus. Talk with your health care  provider to find out if you are at risk for hepatitis B infection. Hepatitis C Testing is recommended for:  Everyone born from [birth year] through 1963.  Anyone with known risk factors for hepatitis C. Sexually transmitted infections (STIs)  Get screened for STIs, including gonorrhea and chlamydia, if: ? You are sexually active and are younger than 47 years of age. ? You are older than 47 years of age and your health care provider tells you that you are at risk for this type of infection. ? Your sexual activity has changed since you were last screened, and you are at increased risk for chlamydia or gonorrhea. Ask your health care provider if you are at risk.  Ask your health care provider about whether you are at high risk for HIV. Your health care provider may recommend a prescription medicine to help prevent HIV infection. If you choose to take medicine to prevent HIV, you should first get tested for HIV. You should then be tested every 3 months for as long as you are taking the medicine. Pregnancy  If you are about to stop having your period (premenopausal) and you may become pregnant, seek counseling before you get pregnant.  Take 400 to 800 micrograms (mcg) of folic acid every day if you become pregnant.  Ask for birth control (contraception) if you want to prevent pregnancy. Osteoporosis and menopause Osteoporosis is a disease in which the bones lose minerals and strength with aging. This can result in bone fractures. If you are 37 years old or older, or if you are at risk for osteoporosis and fractures, ask your health care provider if you should:  Be screened for bone loss.  Take a calcium or vitamin D supplement to lower your risk of fractures.  Be given hormone replacement therapy (HRT) to treat symptoms of menopause. Follow these instructions at home: Lifestyle  Do not use any products that contain nicotine or tobacco, such as cigarettes, e-cigarettes, and chewing tobacco. If you  need help quitting, ask your health care provider.  Do not use street drugs.  Do not share needles.  Ask your health care provider for help if you need support or information about quitting drugs. Alcohol use  Do not drink alcohol if: ? Your health care provider tells you not to drink. ? You are pregnant, may be pregnant, or are planning to become pregnant.  If you drink alcohol: ? Limit how much you use to 0-1 drink a day. ? Limit intake if you are breastfeeding.  Be aware of how much alcohol is in your drink. In the [place] one drink equals one 12 oz bottle of beer (355 mL), one 5 oz glass of wine (148 mL), or one 1 oz glass of hard liquor (44 mL). General instructions  Schedule regular health, dental, and eye exams.  Stay current with your vaccines.  Tell your health care provider if: ? You often feel depressed. ? You have ever been abused or do not feel safe at home. Summary  Adopting a healthy lifestyle and getting preventive care are important in promoting health and wellness.  Follow your health care provider's instructions about healthy diet, exercising, and getting tested or screened for diseases.  Follow your health care provider's instructions on monitoring your cholesterol and blood pressure. This information is not intended to replace advice given to you by your health care provider. Make sure you discuss any questions you have with your health care provider. Document Revised: 06/07/2018 Document Reviewed: 06/07/2018 Elsevier Patient Education  2020 [employer].

## 2019-09-05 LAB — CBC
Hematocrit: 43.1 % (ref 34.0–46.6)
Hemoglobin: 14.4 g/dL (ref 11.1–15.9)
MCH: 31.4 pg (ref 26.6–33.0)
MCHC: 33.4 g/dL (ref 31.5–35.7)
MCV: 94 fL (ref 79–97)
Platelets: 316 10*3/uL (ref 150–450)
RBC: 4.59 x10E6/uL (ref 3.77–5.28)
RDW: 11.9 % (ref 11.7–15.4)
WBC: 5.8 10*3/uL (ref 3.4–10.8)

## 2019-09-05 LAB — LIPID PANEL
Chol/HDL Ratio: 3.2 ratio (ref 0.0–4.4)
Cholesterol, Total: 188 mg/dL (ref 100–199)
HDL: 58 mg/dL (ref >39)
LDL Chol Calc (NIH): 119 mg/dL — ABNORMAL HIGH (ref 0–99)
Triglycerides: 60 mg/dL (ref 0–149)
VLDL Cholesterol Cal: 11 mg/dL (ref 5–40)

## 2019-09-05 LAB — CMP14+EGFR
ALT: 20 IU/L (ref 0–32)
AST: 26 IU/L (ref 0–40)
Albumin/Globulin Ratio: 2 (ref 1.2–2.2)
Albumin: 4.6 g/dL (ref 3.8–4.8)
Alkaline Phosphatase: 71 IU/L (ref 39–117)
BUN/Creatinine Ratio: 10 (ref 9–23)
BUN: 8 mg/dL (ref 6–24)
Bilirubin Total: 0.4 mg/dL (ref 0.0–1.2)
CO2: 23 mmol/L (ref 20–29)
Calcium: 9.7 mg/dL (ref 8.7–10.2)
Chloride: 103 mmol/L (ref 96–106)
Creatinine, Ser: 0.77 mg/dL (ref 0.57–1.00)
GFR calc Af Amer: 106 mL/min/{1.73_m2} (ref >59)
GFR calc non Af Amer: 92 mL/min/{1.73_m2} (ref >59)
Globulin, Total: 2.3 g/dL (ref 1.5–4.5)
Glucose: 76 mg/dL (ref 65–99)
Potassium: 4 mmol/L (ref 3.5–5.2)
Sodium: 140 mmol/L (ref 134–144)
Total Protein: 6.9 g/dL (ref 6.0–8.5)

## 2019-09-05 LAB — FSH/LH
FSH: 8.3 m[IU]/mL
LH: 8.5 m[IU]/mL

## 2019-09-05 LAB — VITAMIN D 25 HYDROXY (VIT D DEFICIENCY, FRACTURES): Vit D, 25-Hydroxy: 23.9 ng/mL — ABNORMAL LOW (ref 30.0–100.0)

## 2019-09-05 LAB — TSH+FREE T4
Free T4: 1.27 ng/dL (ref 0.82–1.77)
TSH: 1.48 u[IU]/mL (ref 0.450–4.500)

## 2019-09-12 ENCOUNTER — Ambulatory Visit: Payer: BC Managed Care – PPO

## 2019-09-17 ENCOUNTER — Encounter: Payer: Self-pay | Admitting: Family Medicine

## 2019-09-19 ENCOUNTER — Other Ambulatory Visit: Payer: Self-pay

## 2019-09-19 ENCOUNTER — Telehealth (INDEPENDENT_AMBULATORY_CARE_PROVIDER_SITE_OTHER): Payer: BC Managed Care – PPO | Admitting: Family Medicine

## 2019-09-19 ENCOUNTER — Encounter: Payer: Self-pay | Admitting: Family Medicine

## 2019-09-19 DIAGNOSIS — F322 Major depressive disorder, single episode, severe without psychotic features: Secondary | ICD-10-CM | POA: Diagnosis not present

## 2019-09-19 DIAGNOSIS — R634 Abnormal weight loss: Secondary | ICD-10-CM

## 2019-09-19 DIAGNOSIS — F411 Generalized anxiety disorder: Secondary | ICD-10-CM | POA: Diagnosis not present

## 2019-09-19 DIAGNOSIS — H1031 Unspecified acute conjunctivitis, right eye: Secondary | ICD-10-CM | POA: Diagnosis not present

## 2019-09-19 MED ORDER — ESCITALOPRAM OXALATE 5 MG PO TABS: 5.0000 mg | ORAL_TABLET | Freq: Every day | ORAL | 1 refills | Status: DC

## 2019-09-19 MED ORDER — POLYMYXIN B-TRIMETHOPRIM 10000-0.1 UNIT/ML-% OP SOLN: 1.0000 [drp] | OPHTHALMIC | 0 refills | Status: DC

## 2019-09-19 NOTE — Progress Notes (Signed)
VIDEO Encounter- SOAP NOTE Established Patient  This telephone encounter was conducted with the patient's (or proxy's) verbal consent via audio telecommunications: yes/no: Yes Patient was instructed to have this encounter in a suitably private space; and to only have persons present to whom they give permission to participate. In addition, patient identity was confirmed by use of name plus two identifiers (DOB and address).  I discussed the limitations, risks, security and privacy concerns of performing an evaluation and management service by telephone and the availability of in person appointments. I also discussed with the patient that there may be a patient responsible charge related to this service. The patient expressed understanding and agreed to proceed.  I spent a total of TIME; 0 MIN TO 60 MIN: 20 minutes talking with the patient or their proxy.  Chief Complaint  Patient presents with  . discuss labs    2 week recheck.  Pt thinks she may have pinkeye, pain, redness and gunk in eyes started on yesterday.  No using otc eye drops.  . Depression    PHQ9 score: 18    Subjective   Rhonda Green is a 49 y.o. established patient. Telephone visit today for  HPI   Patient reports that her right eye was watery with purulent drainage out of hte blue She reports that she did not try anything for the eye She calls it "gunk" She can see normally  She reports that she wants to find out about her labs from when she was evluated the unintentional wieght loss She feels isolated She has a counselor who recommended medication to treat her depression.  She declined because she took cymbalta in the past for migraines and had a bad reaction. Depression screen [facility] 2/9 09/19/2019 09/04/2019 07/11/2018 08/10/2017 05/06/2017  Decreased Interest 3 0 0 0 0  Down, Depressed, Hopeless 3 1 0 0 0  PHQ - 2 Score 6 1 0 0 0  Altered sleeping 2 - - - -  Tired, decreased energy 3 - - - -  Change in appetite 3 -  - - -  Feeling bad or failure about yourself  0 - - - -  Trouble concentrating 1 - - - -  Moving slowly or fidgety/restless 3 - - - -  Suicidal thoughts 0 - - - -  PHQ-9 Score 18 - - - -  Difficult doing work/chores Very difficult - - - -     Patient Active Problem List   Diagnosis Date Noted  . Anxiety 02/05/2015    Past Medical History:  Diagnosis Date  . Anxiety   . Depression   . GERD (gastroesophageal reflux disease)     Current Outpatient Medications  Medication Sig Dispense Refill  . gabapentin (NEURONTIN) 300 MG capsule 1 capsule in morning and bedtime, 1 capsules in the afternoon as needed for anxiety. 180 capsule 3  . OMEPRAZOLE PO Take 20 mg by mouth daily.    . rizatriptan (MAXALT) 10 MG tablet TAKE 1 TABLET BY MOUTH IMMEDIATELY MAY REPEAT AFTER 2 HOURS. DO NOT REPEAT UNTIL THE NEXT DAY. 10 tablet 5  . escitalopram (LEXAPRO) 5 MG tablet Take 1 tablet (5 mg total) by mouth daily. 30 tablet 1  . trimethoprim-polymyxin b (POLYTRIM) ophthalmic solution Place 1 drop into the right eye every 4 (four) hours. 10 mL 0   No current facility-administered medications for this visit.    Allergies  Allergen Reactions  . Morphine And Related Nausea And Vomiting  Social History   Socioeconomic History  . Marital status: Legally Separated    Spouse name: Not on file  . Number of children: 2  . Years of education: Not on file  . Highest education level: Not on file  Occupational History  . Not on file  Tobacco Use  . Smoking status: Never Smoker  . Smokeless tobacco: Never Used  Substance and Sexual Activity  . Alcohol use: No    Alcohol/week: 0.0 standard drinks  . Drug use: No  . Sexual activity: Yes  Other Topics Concern  . Not on file  Social History Narrative  . Not on file   Social Determinants of Health   Financial Resource Strain:   . Difficulty of Paying Living Expenses:   Food Insecurity:   . Worried About Programme researcher, broadcasting/film/video in the Last Year:    . Barista in the Last Year:   Transportation Needs:   . Freight forwarder (Medical):   Marland Kitchen Lack of Transportation (Non-Medical):   Physical Activity:   . Days of Exercise per Week:   . Minutes of Exercise per Session:   Stress:   . Feeling of Stress :   Social Connections:   . Frequency of Communication with Friends and Family:   . Frequency of Social Gatherings with Friends and Family:   . Attends Religious Services:   . Active Member of Clubs or Organizations:   . Attends Banker Meetings:   Marland Kitchen Marital Status:   Intimate Partner Violence:   . Fear of Current or Ex-Partner:   . Emotionally Abused:   Marland Kitchen Physically Abused:   . Sexually Abused:     ROS See hpi  Objective   Vitals as reported by the patient: There were no vitals filed for this visit.  Physical Exam  Constitutional: Oriented to person, place, and time. Appears thin compared to her profile picture. HENT:  Head: Normocephalic and atraumatic.  Eyes: Conjunctivae injected with mild edema on the right and EOM are normal.  Pulmonary/Chest: Effort normal. No respiratory distress.  Neurological: Is alert and oriented to person, place, and time.  Psychiatric: Has a normal mood, but flat affect. Behavior is normal. Judgment and thought content normal.   Tiffine was seen today for discuss labs and depression.  Diagnoses and all orders for this visit:  Unintentional weight loss- reviewed labs, there were no findings to explain her weight loss  GAD (generalized anxiety disorder)- will start lexapro  Acute conjunctivitis of right eye, unspecified acute conjunctivitis type- visible irritated Will send in polytrim  Severe depression (HCC)- will treat depression AND ANXIETY with lexapro  Other orders -     trimethoprim-polymyxin b (POLYTRIM) ophthalmic solution; Place 1 drop into the right eye every 4 (four) hours. -     escitalopram (LEXAPRO) 5 MG tablet; Take 1 tablet (5 mg total) by mouth  daily.     I discussed the assessment and treatment plan with the patient. The patient was provided an opportunity to ask questions and all were answered. The patient agreed with the plan and demonstrated an understanding of the instructions.   The patient was advised to call back or seek an in-person evaluation if the symptoms worsen or if the condition fails to improve as anticipated.  I provided 20 minutes of video face-to-face time during this encounter.  Doristine Bosworth, MD  Primary Care at [facility]

## 2019-09-19 NOTE — Patient Instructions (Signed)
° ° ° °  If you have lab work done today you will be contacted with your lab results within the next 2 weeks.  If you have not heard from us then please contact us. The fastest way to get your results is to register for My Chart. ° ° °IF you received an x-ray today, you will receive an invoice from [facility]. Please contact  Radiology at [phone] with questions or concerns regarding your invoice.  ° °IF you received labwork today, you will receive an invoice from [facility]. Please contact [facility] at [phone] with questions or concerns regarding your invoice.  ° °Our billing staff will not be able to assist you with questions regarding bills from these companies. ° °You will be contacted with the lab results as soon as they are available. The fastest way to get your results is to activate your My Chart account. Instructions are located on the last page of this paperwork. If you have not heard from us regarding the results in 2 weeks, please contact this office. °  ° ° ° °

## 2019-09-20 ENCOUNTER — Other Ambulatory Visit: Payer: Self-pay | Admitting: Family Medicine

## 2019-09-20 DIAGNOSIS — N644 Mastodynia: Secondary | ICD-10-CM

## 2019-09-20 DIAGNOSIS — Z803 Family history of malignant neoplasm of breast: Secondary | ICD-10-CM

## 2019-10-03 ENCOUNTER — Telehealth (INDEPENDENT_AMBULATORY_CARE_PROVIDER_SITE_OTHER): Payer: BC Managed Care – PPO | Admitting: Family Medicine

## 2019-10-03 ENCOUNTER — Other Ambulatory Visit: Payer: Self-pay

## 2019-10-03 DIAGNOSIS — R350 Frequency of micturition: Secondary | ICD-10-CM | POA: Diagnosis not present

## 2019-10-03 LAB — POCT URINALYSIS DIP (MANUAL ENTRY)
Bilirubin, UA: NEGATIVE
Blood, UA: NEGATIVE
Glucose, UA: NEGATIVE mg/dL
Leukocytes, UA: NEGATIVE
Nitrite, UA: NEGATIVE
Protein Ur, POC: NEGATIVE mg/dL
Spec Grav, UA: 1.025 (ref 1.010–1.025)
Urobilinogen, UA: 1 E.U./dL
pH, UA: 6 (ref 5.0–8.0)

## 2019-10-03 MED ORDER — SULFAMETHOXAZOLE-TRIMETHOPRIM 800-160 MG PO TABS: 1.0000 | ORAL_TABLET | Freq: Two times a day (BID) | ORAL | 0 refills | Status: DC

## 2019-10-03 NOTE — Progress Notes (Signed)
Established Patient Office Visit  Subjective:  Patient ID: Rhonda Green, female    DOB: 05/05/1971  Age: 49 y.o. MRN: [id number]  CC:  Chief Complaint  Patient presents with  . uti symptoms x 4 days    per pt having urinary frequency and pressure.  Per pt when she has a uti she has flu like symptoms and decreased appetite and she is experiencing this now. Advised pt to drop off urine sample to office for analysis and we will have results before her video visit.  Pt agreeable and will come over now to give sample.    HPI Rhonda Green presents for   Pt reports that her UTI is back Last episode January Discussed that she gets nausea and lack of appetite when she gets a uti and chills. She states that she was here for   Past Medical History:  Diagnosis Date  . Anxiety   . Depression   . GERD (gastroesophageal reflux disease)     Past Surgical History:  Procedure Laterality Date  . ABDOMINAL HYSTERECTOMY    . CESAREAN SECTION    . CHOLECYSTECTOMY      Family History  Problem Relation Age of Onset  . Diabetes Mother   . Hypertension Mother   . Anuerysm Father     Social History   Socioeconomic History  . Marital status: Legally Separated    Spouse name: Not on file  . Number of children: 2  . Years of education: Not on file  . Highest education level: Not on file  Occupational History  . Not on file  Tobacco Use  . Smoking status: Never Smoker  . Smokeless tobacco: Never Used  Substance and Sexual Activity  . Alcohol use: No    Alcohol/week: 0.0 standard drinks  . Drug use: No  . Sexual activity: Yes  Other Topics Concern  . Not on file  Social History Narrative  . Not on file   Social Determinants of Health   Financial Resource Strain:   . Difficulty of Paying Living Expenses:   Food Insecurity:   . Worried About Charity fundraiser in the Last Year:   . Arboriculturist in the Last Year:   Transportation Needs:   . Film/video editor (Medical):    Marland Kitchen Lack of Transportation (Non-Medical):   Physical Activity:   . Days of Exercise per Week:   . Minutes of Exercise per Session:   Stress:   . Feeling of Stress :   Social Connections:   . Frequency of Communication with Friends and Family:   . Frequency of Social Gatherings with Friends and Family:   . Attends Religious Services:   . Active Member of Clubs or Organizations:   . Attends Archivist Meetings:   Marland Kitchen Marital Status:   Intimate Partner Violence:   . Fear of Current or Ex-Partner:   . Emotionally Abused:   Marland Kitchen Physically Abused:   . Sexually Abused:     Outpatient Medications Prior to Visit  Medication Sig Dispense Refill  . escitalopram (LEXAPRO) 5 MG tablet Take 1 tablet (5 mg total) by mouth daily. 30 tablet 1  . gabapentin (NEURONTIN) 300 MG capsule 1 capsule in morning and bedtime, 1 capsules in the afternoon as needed for anxiety. 180 capsule 3  . OMEPRAZOLE PO Take 20 mg by mouth daily.    . rizatriptan (MAXALT) 10 MG tablet TAKE 1 TABLET BY MOUTH IMMEDIATELY MAY REPEAT AFTER 2 HOURS.  DO NOT REPEAT UNTIL THE NEXT DAY. 10 tablet 5  . trimethoprim-polymyxin b (POLYTRIM) ophthalmic solution Place 1 drop into the right eye every 4 (four) hours. (Patient not taking: Reported on 10/03/2019) 10 mL 0   No facility-administered medications prior to visit.    Allergies  Allergen Reactions  . Morphine And Related Nausea And Vomiting    ROS Review of Systems    Objective:    Physical Exam There were no vitals filed for this visit.  There were no vitals taken for this visit. Wt Readings from Last 3 Encounters:  09/04/19 116 lb (52.6 kg)  05/28/19 130 lb (59 kg)  07/11/18 115 lb 12.8 oz (52.5 kg)   Physical Exam  Constitutional: Oriented to person, place, and time. Appears well-developed and well-nourished.  HENT:  Head: Normocephalic and atraumatic.  Eyes: Conjunctivae and EOM are normal.  Cardiovascular: Normal rate, regular rhythm, normal heart  sounds and intact distal pulses.  No murmur heard. Pulmonary/Chest: Effort normal and breath sounds normal. No stridor. No respiratory distress. Has no wheezes.  Neurological: Is alert and oriented to person, place, and time.  Skin: Skin is warm. Capillary refill takes less than 2 seconds.  Psychiatric: Has a normal mood and affect. Behavior is normal. Judgment and thought content normal.    Health Maintenance Due  Topic Date Due  . PAP SMEAR-Modifier  Never done    There are no preventive care reminders to display for this patient.  Lab Results  Component Value Date   TSH 1.480 09/04/2019   Lab Results  Component Value Date   WBC 5.8 09/04/2019   HGB 14.4 09/04/2019   HCT 43.1 09/04/2019   MCV 94 09/04/2019   PLT 316 09/04/2019   Lab Results  Component Value Date   NA 140 09/04/2019   K 4.0 09/04/2019   CO2 23 09/04/2019   GLUCOSE 76 09/04/2019   BUN 8 09/04/2019   CREATININE 0.77 09/04/2019   BILITOT 0.4 09/04/2019   ALKPHOS 71 09/04/2019   AST 26 09/04/2019   ALT 20 09/04/2019   PROT 6.9 09/04/2019   ALBUMIN 4.6 09/04/2019   CALCIUM 9.7 09/04/2019   ANIONGAP 6 07/19/2017   Lab Results  Component Value Date   CHOL 188 09/04/2019   Lab Results  Component Value Date   HDL 58 09/04/2019   Lab Results  Component Value Date   LDLCALC 119 (H) 09/04/2019   Lab Results  Component Value Date   TRIG 60 09/04/2019   Lab Results  Component Value Date   CHOLHDL 3.2 09/04/2019   No results found for: HGBA1C    Assessment & Plan:   Problem List Items Addressed This Visit    None    Visit Diagnoses    Urinary frequency    -  Primary   Relevant Orders   POCT urinalysis dipstick (Completed)   Urine Culture     Will treat empirically with bactrim while awaiting cultures   Meds ordered this encounter  Medications  . sulfamethoxazole-trimethoprim (BACTRIM DS) 800-160 MG tablet    Sig: Take 1 tablet by mouth 2 (two) times daily.    Dispense:  6 tablet     Refill:  0    Follow-up: No follow-ups on file.    Doristine Bosworth, MD

## 2019-10-03 NOTE — Patient Instructions (Addendum)
  Continue hydration, small meals, and take the antibiotic.     If you have lab work done today you will be contacted with your lab results within the next 2 weeks.  If you have not heard from [place] then please contact us. The fastest way to get your results is to register for My Chart.   IF you received an x-ray today, you will receive an invoice from [facility]. Please contact [facility] at [phone] with questions or concerns regarding your invoice.   IF you received labwork today, you will receive an invoice from [place]. Please contact [facility] at [phone] with questions or concerns regarding your invoice.   Our billing staff will not be able to assist you with questions regarding bills from these companies.  You will be contacted with the lab results as soon as they are available. The fastest way to get your results is to activate your My Chart account. Instructions are located on the last page of this paperwork. If you have not heard from [place] regarding the results in 2 weeks, please contact this office.

## 2019-10-08 ENCOUNTER — Other Ambulatory Visit: Payer: Self-pay

## 2019-10-08 ENCOUNTER — Ambulatory Visit
Admission: RE | Admit: 2019-10-08 | Discharge: 2019-10-08 | Disposition: A | Discharge status: Home / Self Care | Payer: BC Managed Care – PPO | Source: Ambulatory Visit | Attending: Family Medicine | Admitting: Family Medicine

## 2019-10-08 DIAGNOSIS — Z803 Family history of malignant neoplasm of breast: Secondary | ICD-10-CM

## 2019-10-08 DIAGNOSIS — N644 Mastodynia: Secondary | ICD-10-CM

## 2019-10-18 ENCOUNTER — Other Ambulatory Visit: Payer: Self-pay

## 2019-10-18 ENCOUNTER — Encounter (HOSPITAL_COMMUNITY): Payer: Self-pay | Admitting: *Deleted

## 2019-10-18 ENCOUNTER — Emergency Department (HOSPITAL_COMMUNITY)
Admission: EM | Admit: 2019-10-18 | Discharge: 2019-10-19 | Disposition: A | Discharge status: Home / Self Care | Payer: BC Managed Care – PPO | Attending: Emergency Medicine | Admitting: Emergency Medicine

## 2019-10-18 DIAGNOSIS — R6883 Chills (without fever): Secondary | ICD-10-CM | POA: Insufficient documentation

## 2019-10-18 DIAGNOSIS — Z20822 Contact with and (suspected) exposure to covid-19: Secondary | ICD-10-CM | POA: Insufficient documentation

## 2019-10-18 DIAGNOSIS — R1110 Vomiting, unspecified: Secondary | ICD-10-CM | POA: Diagnosis present

## 2019-10-18 DIAGNOSIS — K5792 Diverticulitis of intestine, part unspecified, without perforation or abscess without bleeding: Secondary | ICD-10-CM | POA: Diagnosis not present

## 2019-10-18 MED ORDER — SODIUM CHLORIDE 0.9% FLUSH
3.0000 mL | Freq: Once | INTRAVENOUS | Status: AC
  Administered 2019-10-19: 3 mL via INTRAVENOUS

## 2019-10-18 MED ORDER — ONDANSETRON 4 MG PO TBDP
4.0000 mg | ORAL_TABLET | Freq: Once | ORAL | Status: AC | PRN
  Administered 2019-10-18: 4 mg via ORAL
  Filled 2019-10-18: qty 1

## 2019-10-19 ENCOUNTER — Emergency Department (HOSPITAL_COMMUNITY): Payer: BC Managed Care – PPO

## 2019-10-19 LAB — URINALYSIS, ROUTINE W REFLEX MICROSCOPIC
Glucose, UA: NEGATIVE mg/dL
Hgb urine dipstick: NEGATIVE
Ketones, ur: 15 mg/dL — AB
Leukocytes,Ua: NEGATIVE
Nitrite: NEGATIVE
Protein, ur: 30 mg/dL — AB
Specific Gravity, Urine: >1.03 — ABNORMAL HIGH (ref 1.005–1.030)
pH: 5.5 (ref 5.0–8.0)

## 2019-10-19 LAB — COMPREHENSIVE METABOLIC PANEL
ALT: 27 U/L (ref 0–44)
AST: 25 U/L (ref 15–41)
Albumin: 4 g/dL (ref 3.5–5.0)
Alkaline Phosphatase: 65 U/L (ref 38–126)
Anion gap: 11 (ref 5–15)
BUN: 14 mg/dL (ref 6–20)
CO2: 21 mmol/L — ABNORMAL LOW (ref 22–32)
Calcium: 9 mg/dL (ref 8.9–10.3)
Chloride: 109 mmol/L (ref 98–111)
Creatinine, Ser: 0.8 mg/dL (ref 0.44–1.00)
GFR calc Af Amer: >60 mL/min (ref >60)
GFR calc non Af Amer: >60 mL/min (ref >60)
Glucose, Bld: 137 mg/dL — ABNORMAL HIGH (ref 70–99)
Potassium: 4.1 mmol/L (ref 3.5–5.1)
Sodium: 141 mmol/L (ref 135–145)
Total Bilirubin: 1.3 mg/dL — ABNORMAL HIGH (ref 0.3–1.2)
Total Protein: 7 g/dL (ref 6.5–8.1)

## 2019-10-19 LAB — URINALYSIS, MICROSCOPIC (REFLEX): RBC / HPF: NONE SEEN RBC/hpf (ref 0–5)

## 2019-10-19 LAB — CBC
HCT: 43.9 % (ref 36.0–46.0)
Hemoglobin: 14.8 g/dL (ref 12.0–15.0)
MCH: 30.8 pg (ref 26.0–34.0)
MCHC: 33.7 g/dL (ref 30.0–36.0)
MCV: 91.5 fL (ref 80.0–100.0)
Platelets: 314 10*3/uL (ref 150–400)
RBC: 4.8 MIL/uL (ref 3.87–5.11)
RDW: 12.2 % (ref 11.5–15.5)
WBC: 12.6 10*3/uL — ABNORMAL HIGH (ref 4.0–10.5)
nRBC: 0 % (ref 0.0–0.2)

## 2019-10-19 LAB — LIPASE, BLOOD: Lipase: 16 U/L (ref 11–51)

## 2019-10-19 LAB — SARS CORONAVIRUS 2 (TAT 6-24 HRS): SARS Coronavirus 2: NEGATIVE

## 2019-10-19 MED ORDER — AMOXICILLIN-POT CLAVULANATE 875-125 MG PO TABS: 1.0000 | ORAL_TABLET | Freq: Two times a day (BID) | ORAL | 0 refills | Status: DC

## 2019-10-19 MED ORDER — METOCLOPRAMIDE HCL 5 MG/ML IJ SOLN
10.0000 mg | Freq: Once | INTRAMUSCULAR | Status: AC
  Administered 2019-10-19: 10 mg via INTRAVENOUS
  Filled 2019-10-19: qty 2

## 2019-10-19 MED ORDER — IOHEXOL 300 MG/ML  SOLN
100.0000 mL | Freq: Once | INTRAMUSCULAR | Status: AC | PRN
  Administered 2019-10-19: 100 mL via INTRAVENOUS

## 2019-10-19 MED ORDER — METOCLOPRAMIDE HCL 10 MG PO TABS: 10.0000 mg | ORAL_TABLET | Freq: Four times a day (QID) | ORAL | 0 refills | Status: DC | PRN

## 2019-10-19 MED ORDER — ONDANSETRON 4 MG PO TBDP: 4.0000 mg | ORAL_TABLET | Freq: Three times a day (TID) | ORAL | 0 refills | Status: DC | PRN

## 2019-10-19 NOTE — ED Provider Notes (Signed)
[facility] EMERGENCY DEPARTMENT Provider Note   CSN: [id number] Arrival date & time: 10/18/19  2327     History Chief Complaint  Patient presents with  . Emesis    Rhonda Green is a 49 y.o. female with a past medical history of reflux, anxiety and depression who presents emergency department with complaint of vomiting.  The patient states that she has had similar episodes of this in the past that occur when she has a urinary tract infection.  She denies urinary symptoms but complains of 3 days of intractable vomiting.  She has had some associated chills and watery diarrhea over the past 24 hours.  She has no contacts with similar symptoms, no recent foreign travel or ingestion of suspicious foods.  She denies any Covid virus exposure.  She is not been vaccinated.  HPI     Past Medical History:  Diagnosis Date  . Anxiety   . Depression   . GERD (gastroesophageal reflux disease)     Patient Active Problem List   Diagnosis Date Noted  . Anxiety 02/05/2015    Past Surgical History:  Procedure Laterality Date  . ABDOMINAL HYSTERECTOMY    . CESAREAN SECTION    . CHOLECYSTECTOMY       OB History   No obstetric history on file.     Family History  Problem Relation Age of Onset  . Diabetes Mother   . Hypertension Mother   . Breast cancer Mother   . Anuerysm Father     Social History   Tobacco Use  . Smoking status: Never Smoker  . Smokeless tobacco: Never Used  Substance Use Topics  . Alcohol use: No    Alcohol/week: 0.0 standard drinks  . Drug use: No    Home Medications Prior to Admission medications   Medication Sig Start Date End Date Taking? Authorizing Provider  escitalopram (LEXAPRO) 5 MG tablet Take 1 tablet (5 mg total) by mouth daily. 09/19/19  Yes Delia Chimes A, MD  gabapentin (NEURONTIN) 300 MG capsule 1 capsule in morning and bedtime, 1 capsules in the afternoon as needed for anxiety. Patient taking differently: Take 300 mg  by mouth See admin instructions. 1 capsule in morning and bedtime, 1 capsules in the afternoon as needed for anxiety. 05/28/19  Yes Stallings, Zoe A, MD  OMEPRAZOLE PO Take 20 mg by mouth daily.   Yes [provider]  rizatriptan (MAXALT) 10 MG tablet TAKE 1 TABLET BY MOUTH IMMEDIATELY MAY REPEAT AFTER 2 HOURS. DO NOT REPEAT UNTIL THE NEXT DAY. Patient taking differently: Take 5 mg by mouth See admin instructions. TAKE 1 TABLET BY MOUTH IMMEDIATELY MAY REPEAT AFTER 2 HOURS. DO NOT REPEAT UNTIL THE NEXT DAY. 01/16/19  Yes Delia Chimes A, MD  amoxicillin-clavulanate (AUGMENTIN) 875-125 MG tablet Take 1 tablet by mouth 2 (two) times daily. One po bid x 7 days 10/19/19   Margarita Mail, PA-C  ondansetron (ZOFRAN ODT) 4 MG disintegrating tablet Take 1 tablet (4 mg total) by mouth every 8 (eight) hours as needed for nausea or vomiting. 10/19/19   Margarita Mail, PA-C  sulfamethoxazole-trimethoprim (BACTRIM DS) 800-160 MG tablet Take 1 tablet by mouth 2 (two) times daily. Patient not taking: Reported on 10/19/2019 10/03/19   Forrest Moron, MD  trimethoprim-polymyxin b (POLYTRIM) ophthalmic solution Place 1 drop into the right eye every 4 (four) hours. Patient not taking: Reported on 10/03/2019 09/19/19   Forrest Moron, MD  metoCLOPramide (REGLAN) 10 MG tablet Take 1 tablet (  10 mg total) by mouth every 6 (six) hours as needed for nausea (nausea/headache). 10/19/19 10/19/19  Arthor Captain, PA-C    Allergies    Morphine and related  Review of Systems   Review of Systems Ten systems reviewed and are negative for acute change, except as noted in the HPI.   Physical Exam Updated Vital Signs BP 115/72 (BP Location: Right Arm)   Pulse 72   Temp 98 F (36.7 C) (Oral)   Resp 16   SpO2 98%   Physical Exam Vitals and nursing note reviewed.  Constitutional:      General: She is not in acute distress.    Appearance: She is well-developed and underweight. She is not diaphoretic.  HENT:      Head: Normocephalic and atraumatic.     Mouth/Throat:     Mouth: Mucous membranes are dry.  Eyes:     General: No scleral icterus.    Extraocular Movements: Extraocular movements intact.     Conjunctiva/sclera: Conjunctivae normal.     Pupils: Pupils are equal, round, and reactive to light.  Cardiovascular:     Rate and Rhythm: Normal rate and regular rhythm.     Heart sounds: Normal heart sounds. No murmur. No friction rub. No gallop.   Pulmonary:     Effort: Pulmonary effort is normal. No respiratory distress.     Breath sounds: Normal breath sounds.  Abdominal:     General: Bowel sounds are normal. There is no distension.     Palpations: Abdomen is soft. There is no mass.     Tenderness: There is abdominal tenderness in the left lower quadrant. There is no guarding.    Musculoskeletal:     Cervical back: Normal range of motion.  Skin:    General: Skin is warm and dry.  Neurological:     Mental Status: She is alert and oriented to person, place, and time.  Psychiatric:        Behavior: Behavior normal.     ED Results / Procedures / Treatments   Labs (all labs ordered are listed, but only abnormal results are displayed) Labs Reviewed  COMPREHENSIVE METABOLIC PANEL - Abnormal; Notable for the following components:      Result Value   CO2 21 (*)    Glucose, Bld 137 (*)    Total Bilirubin 1.3 (*)    All other components within normal limits  CBC - Abnormal; Notable for the following components:   WBC 12.6 (*)    All other components within normal limits  URINALYSIS, ROUTINE W REFLEX MICROSCOPIC - Abnormal; Notable for the following components:   Specific Gravity, Urine >1.030 (*)    Bilirubin Urine SMALL (*)    Ketones, ur 15 (*)    Protein, ur 30 (*)    All other components within normal limits  URINALYSIS, MICROSCOPIC (REFLEX) - Abnormal; Notable for the following components:   Bacteria, UA RARE (*)    All other components within normal limits  SARS CORONAVIRUS 2  (TAT 6-24 HRS)  URINE CULTURE  LIPASE, BLOOD    EKG None  Radiology CT ABDOMEN PELVIS W CONTRAST  Result Date: 10/19/2019 CLINICAL DATA:  Abdominal pain and vomiting. EXAM: CT ABDOMEN AND PELVIS WITH CONTRAST TECHNIQUE: Multidetector CT imaging of the abdomen and pelvis was performed using the standard protocol following bolus administration of intravenous contrast. CONTRAST:  OMNIPAQUE IOHEXOL 300 MG/ML  SOLN COMPARISON:  05/04/2015 FINDINGS: Lower chest: The lung bases are clear of acute process. No  pleural effusion or pulmonary lesions. The heart is normal in size. No pericardial effusion. The distal esophagus and aorta are unremarkable. Hepatobiliary: No a Paddock lesions are identified. No intrahepatic biliary dilatation. The gallbladder is surgically absent. No common bile duct dilatation. Pancreas: No mass, inflammation or ductal dilatation. Spleen: Normal size. No focal lesions. Adrenals/Urinary Tract: The adrenal glands and kidneys are unremarkable. The bladder is unremarkable. Stomach/Bowel: The stomach, duodenum, small bowel and terminal ileum are unremarkable. No acute inflammatory changes, mass lesions or obstructive findings. The terminal ileum is normal. The appendix is not identified and may be surgically absent. Descending colon diverticulosis with distal descending colon wall thickening and inflammation suggesting mild uncomplicated diverticulitis. The sigmoid colon and rectum are unremarkable. Vascular/Lymphatic: The aorta is normal in caliber. No dissection. The branch vessels are patent. The major venous structures are patent. No mesenteric or retroperitoneal mass or adenopathy. Small scattered lymph nodes are noted. Reproductive: The uterus is surgically absent. Both ovaries are still present. There is a small rim enhancing corpus luteum type cyst involving the right ovary. The left ovary is normal. Other: No free pelvic fluid collections. No inguinal mass or adenopathy.  Musculoskeletal: No significant bony findings. IMPRESSION: 1. Mild uncomplicated diverticulitis involving the distal descending colon. 2. No other significant abdominal/pelvic findings, mass lesions or adenopathy. 3. Status post cholecystectomy. No biliary dilatation. 4. Status post hysterectomy. Both ovaries are still present and are unremarkable. Electronically Signed   By: Rudie Meyer M.D.   On: 10/19/2019 12:32    Procedures Procedures (including critical care time)  Medications Ordered in ED Medications  sodium chloride flush (NS) 0.9 % injection 3 mL (3 mLs Intravenous Given 10/19/19 0938)  ondansetron (ZOFRAN-ODT) disintegrating tablet 4 mg (4 mg Oral Given 10/18/19 2339)  metoCLOPramide (REGLAN) injection 10 mg (10 mg Intravenous Given 10/19/19 0938)  iohexol (OMNIPAQUE) 300 MG/ML solution 100 mL (100 mLs Intravenous Contrast Given 10/19/19 1216)    ED Course  I have reviewed the triage vital signs and the nursing notes.  Pertinent labs & imaging results that were available during my care of the patient were reviewed by me and considered in my medical decision making (see chart for details).    MDM Rules/Calculators/A&P                      CC: Abdominal pain, nausea vomiting and diarrhea VS:  Vitals:   10/19/19 1100 10/19/19 1115 10/19/19 1145 10/19/19 1327  BP: 106/65 108/64 92/62 115/72  Pulse: 65 73 82 72  Resp: 18 19 15 16   Temp:    98 F (36.7 C)  TempSrc:    Oral  SpO2: 97% 97% 95% 98%    is gathered by patient and EMR. Previous records obtained and reviewed. DDX:The patient's complaint of nausea vomiting and diarrhea involves an extensive number of diagnostic and treatment options, and is a complaint that carries with it a high risk of complications, morbidity, and potential mortality. Given the large differential diagnosis, medical decision making is of high complexity. The emergent differential diagnosis for vomiting includes, but is not limited to  ACS/MI, Boerhaave's, DKA, Intracranial Hemorrhage, Ischemic bowel, Meningitis, Sepsis, Acute radiation syndrome, Acute gastric dilation, Acetaminophen toxicity, Adrenal insufficiency, Appendicitis, Aspirin toxicity, Bowel obstruction/ileus, Carbon monoxide poisoning, Cholecystitis, CNS tumor. Digoxin toxicity, Electrolyte abnormalities, Elevated ICP, Gastric outlet obstruction, Hyperemesis gravidarum, Pancreatitis, Peritonitis, Ruptured viscus, Testicular torsion/ovarian torsion, Theophyline toxicity, Biliary colic, Cannabinoid hyperemesis syndrome, Chemotherapy, Disulfiram effect, Erythromycin, ETOH, Gastritis, Gastroenteritis, Gastroparesis, Hepatitis, Ibuprofen,  Ipecac toxicity, Labyrinthitis, Migraine, Motion sickness, Narcotic withdrawal, Thyroid, Pregnancy, Peptic ulcer disease, Renal colic, and UTI  Labs: I ordered reviewed and interpreted labs which include a Covid test which is negative, CBC shows elevated white blood cell count.  Lipase within normal limits, urine shows rare bacteria likely contaminated.  She does have some budding yeast.  Urine without evidence of infection.  CMP shows mildly elevated blood glucose likely acute phase reaction. Imaging: I ordered and reviewed images which included CT abdomen and pelvis with contrast. I independently visualized and interpreted all imaging. Significant findings include mild diverticulitis in the left lower quadrant.  This correlates with clinical examination and point tenderness..   EKG: N/A Consults: N/A MDM: Patient with mild diverticulitis in the emergency department.  She has had no active vomiting during the time she has been observed by me.  Patient had an extended course proceeded by a long wait in the waiting room.  Patient able to tolerate p.o. fluids.  She will be discharged on Augmentin and given Zofran ODT.  Patient is feeling much better.  I have advised clear liquid diet.  She has no pain complaints at this time appears appropriate for  discharge.  Patient will follow up with PCP. Patient disposition: Discharge The patient appears reasonably screened and/or stabilized for discharge and I doubt any other medical condition or other [facility] requiring further screening, evaluation, or treatment in the ED at this time prior to discharge. I have discussed lab and/or imaging findings with the patient and answered all questions/concerns to the best of my ability.I have discussed return precautions and OP follow up.    Final Clinical Impression(s) / ED Diagnoses Final diagnoses:  Diverticulitis    Rx / DC Orders ED Discharge Orders         Ordered    metoCLOPramide (REGLAN) 10 MG tablet  Every 6 hours PRN,   Status:  Discontinued     10/19/19 1319    amoxicillin-clavulanate (AUGMENTIN) 875-125 MG tablet  2 times daily     10/19/19 1319    ondansetron (ZOFRAN ODT) 4 MG disintegrating tablet  Every 8 hours PRN     10/19/19 1359           Arthor Captain, PA-C 10/19/19 1847    Tegeler, Canary Brim, MD 10/22/19 [phone]

## 2019-10-19 NOTE — ED Notes (Signed)
Pt restful, curled up in bed without acute guarding or c/o nausea.  Last emesis 2300 yesterday.  HA starting at this time time as well, pain localized to abdomen with c/o soreness from vomiting at a "3". Alert and oriented.

## 2019-10-19 NOTE — ED Notes (Signed)
Patient transported to CT 

## 2019-10-19 NOTE — ED Notes (Signed)
Pt up ambulatory to the Br without need for (A).  Denies any current needs.  Has been drinking oral contrast without concerns.

## 2019-10-19 NOTE — Discharge Instructions (Signed)
Maintain a clear liquid diet for the next 3 to 5 days. Take the medications I have prescribed for nausea and for treatment of the infection. Contact a health care provider if: Your pain does not improve. You have a hard time drinking or eating food. Your bowel movements do not return to normal. Get help right away if: Your pain gets worse. Your symptoms do not get better with treatment. Your symptoms suddenly get worse. You have a fever. You vomit more than one time. You have stools that are bloody, black, or tarry.

## 2019-10-20 LAB — URINE CULTURE
Culture: NO GROWTH
Special Requests: NORMAL

## 2019-11-03 ENCOUNTER — Other Ambulatory Visit: Payer: Self-pay | Admitting: Family Medicine

## 2019-11-03 DIAGNOSIS — G43109 Migraine with aura, not intractable, without status migrainosus: Secondary | ICD-10-CM

## 2019-11-03 NOTE — Telephone Encounter (Signed)
Requested Prescriptions  Pending Prescriptions Disp Refills  . rizatriptan (MAXALT) 10 MG tablet [Pharmacy Med Name: RIZATRIPTAN 10MG  TABLETS] 10 tablet 5    Sig: TAKE 1 TABLET BY MOUTH DAILY MAY REPEAT AFTER 2 HOURS. DO NOT REPEAT UNTIL NEXT DAY.     Neurology:  Migraine Therapy - Triptan Passed - 11/03/2019 11:21 AM      Passed - Last BP in normal range    BP Readings from Last 1 Encounters:  10/19/19 115/72         Passed - Valid encounter within last 12 months    Recent Outpatient Visits          1 month ago Urinary frequency   [facility] at [facility], [facility] A, MD   1 month ago Unintentional weight loss   Primary Care at [facility], [facility], MD   2 months ago Encounter for health maintenance examination in adult   Primary Care at [facility], [facility], MD   5 months ago Encounter for medication monitoring   Primary Care at [facility], [facility], MD   1 year ago UTI symptoms   Primary Care at [facility], [facility], MD

## 2019-11-13 ENCOUNTER — Other Ambulatory Visit: Payer: Self-pay | Admitting: Family Medicine

## 2019-11-13 NOTE — Telephone Encounter (Signed)
Requested medication (s) are due for refill today- yes  Requested medication (s) are on the active medication list -yes  Future visit scheduled -no  Last refill: 10/16/19   Notes to clinic: Patient has only ever seen PCP- no other provider- sent for review of request  Requested Prescriptions  Pending Prescriptions Disp Refills   escitalopram (LEXAPRO) 5 MG tablet [Pharmacy Med Name: ESCITALOPRAM 5MG  TABLETS] 30 tablet 1    Sig: TAKE 1 TABLET(5 MG) BY MOUTH DAILY      Psychiatry:  Antidepressants - SSRI Passed - 11/13/2019 11:26 AM      Passed - Valid encounter within last 6 months    Recent Outpatient Visits           1 month ago Urinary frequency   Primary Care at [facility], Zoe A, MD   1 month ago Unintentional weight loss   Primary Care at [facility], [facility], MD   2 months ago Encounter for health maintenance examination in adult   Primary Care at [facility], [facility], MD   5 months ago Encounter for medication monitoring   Primary Care at [facility], [facility], MD   1 year ago UTI symptoms   Primary Care at [facility], [facility], MD                  Requested Prescriptions  Pending Prescriptions Disp Refills   escitalopram (LEXAPRO) 5 MG tablet [Pharmacy Med Name: ESCITALOPRAM 5MG  TABLETS] 30 tablet 1    Sig: TAKE 1 TABLET(5 MG) BY MOUTH DAILY      Psychiatry:  Antidepressants - SSRI Passed - 11/13/2019 11:26 AM      Passed - Valid encounter within last 6 months    Recent Outpatient Visits           1 month ago Urinary frequency   Primary Care at [facility], Zoe A, MD   1 month ago Unintentional weight loss   Primary Care at [facility], [facility], MD   2 months ago Encounter for health maintenance examination in adult   Primary Care at [facility], Manus Rudd, MD   5 months ago Encounter for medication monitoring   Primary Care at [facility], Manus Rudd, MD   1 year ago UTI symptoms   Primary Care at [facility], Manus Rudd, MD

## 2019-11-13 NOTE — Telephone Encounter (Signed)
Patient is requesting a refill of the following medications: Requested Prescriptions   Pending Prescriptions Disp Refills  . escitalopram (LEXAPRO) 5 MG tablet [Pharmacy Med Name: ESCITALOPRAM 5MG  TABLETS] 30 tablet 1    Sig: TAKE 1 TABLET(5 MG) BY MOUTH DAILY    Date of patient request: 11/13/2019 Last office visit: 10/03/2019 Date of last refill: 09/19/2019 Last refill amount: 30 Tablets  Follow up time period per chart: N/A

## 2019-11-29 ENCOUNTER — Ambulatory Visit
Admission: RE | Admit: 2019-11-29 | Discharge: 2019-11-29 | Disposition: A | Discharge status: Home / Self Care | Payer: BC Managed Care – PPO | Source: Ambulatory Visit | Attending: Family Medicine | Admitting: Family Medicine

## 2019-11-29 ENCOUNTER — Other Ambulatory Visit: Payer: Self-pay

## 2019-11-29 DIAGNOSIS — Z8262 Family history of osteoporosis: Secondary | ICD-10-CM

## 2019-11-29 DIAGNOSIS — Z13820 Encounter for screening for osteoporosis: Secondary | ICD-10-CM

## 2020-01-24 ENCOUNTER — Telehealth: Payer: Self-pay | Admitting: Registered Nurse

## 2020-01-24 ENCOUNTER — Other Ambulatory Visit: Payer: Self-pay | Admitting: Emergency Medicine

## 2020-01-24 DIAGNOSIS — F411 Generalized anxiety disorder: Secondary | ICD-10-CM

## 2020-01-24 MED ORDER — ESCITALOPRAM OXALATE 5 MG PO TABS: ORAL_TABLET | ORAL | 0 refills | Status: DC

## 2020-01-24 NOTE — Telephone Encounter (Signed)
30 day supply has been sent as a [employer], [employer].

## 2020-01-24 NOTE — Telephone Encounter (Signed)
Pt has toc 1st avail with Rhonda Green 08.31/2021  Need refills  What is the name of the medicationescitalopram (LEXAPRO) 5 MG tablet [[id number]   Have you contacted your pharmacy to request a refill? n  Which pharmacy would you like this sent to? [id number] [name], [place] - [phone] AT [facility] [address]  [address] [name] [address]  Phone:  [phone] Fax:  [phone]  DEA #:  [id number]   Patient notified that their request is being sent to the clinical staff for review and that they should receive a call once it is complete. If they do not receive a call within 72 hours they can check with their pharmacy or our office.

## 2020-02-26 ENCOUNTER — Encounter: Payer: Self-pay | Admitting: Registered Nurse

## 2020-02-26 ENCOUNTER — Ambulatory Visit: Payer: BC Managed Care – PPO | Admitting: Registered Nurse

## 2020-02-26 ENCOUNTER — Other Ambulatory Visit: Payer: Self-pay

## 2020-02-26 VITALS — BP 115/74 | HR 76 | Temp 97.5°F | Ht 69.0 in | Wt 120.0 lb

## 2020-02-26 DIAGNOSIS — R12 Heartburn: Secondary | ICD-10-CM

## 2020-02-26 DIAGNOSIS — G43109 Migraine with aura, not intractable, without status migrainosus: Secondary | ICD-10-CM | POA: Diagnosis not present

## 2020-02-26 DIAGNOSIS — F411 Generalized anxiety disorder: Secondary | ICD-10-CM

## 2020-02-26 MED ORDER — ESCITALOPRAM OXALATE 5 MG PO TABS: ORAL_TABLET | ORAL | 3 refills | Status: DC

## 2020-02-26 MED ORDER — OMEPRAZOLE 20 MG PO CPDR: 20.0000 mg | DELAYED_RELEASE_CAPSULE | Freq: Every day | ORAL | 3 refills | Status: DC

## 2020-02-26 MED ORDER — GABAPENTIN 300 MG PO CAPS: ORAL_CAPSULE | ORAL | 3 refills | Status: DC

## 2020-02-26 MED ORDER — RIZATRIPTAN BENZOATE 10 MG PO TABS: ORAL_TABLET | ORAL | 3 refills | Status: DC

## 2020-02-26 NOTE — Patient Instructions (Signed)
° ° ° °  If you have lab work done today you will be contacted with your lab results within the next 2 weeks.  If you have not heard from us then please contact us. The fastest way to get your results is to register for My Chart. ° ° °IF you received an x-ray today, you will receive an invoice from [facility]. Please contact [facility] at [phone] with questions or concerns regarding your invoice.  ° °IF you received labwork today, you will receive an invoice from [facility]. Please contact [facility] at [phone] with questions or concerns regarding your invoice.  ° °Our billing staff will not be able to assist you with questions regarding bills from these companies. ° °You will be contacted with the lab results as soon as they are available. The fastest way to get your results is to activate your My Chart account. Instructions are located on the last page of this paperwork. If you have not heard from us regarding the results in 2 weeks, please contact this office. °  ° ° ° °

## 2020-02-26 NOTE — Progress Notes (Signed)
Established Patient Office Visit  Subjective:  Patient ID: Rhonda Green, female    DOB: Apr 06, 1971  Age: 49 y.o. MRN: [id number]  CC:  Chief Complaint  Patient presents with  . Transitions Of Care    from [place]   . Medication Refill    lexapro & Gabapentin     HPI [name] presents for TOC from [facility] to myself  Med refills: Lexapro 5mg  PO qd and gabapentin 300mg  PO bid with option for third in afternoon. Good effect. Feels anxiety is very well controled. Denies HI/SI. No complaints about this regimen, denies AEs.  Migraines: takes Maxalt "a few times a week". Feels that migraines have become less frequent   Past Medical History:  Diagnosis Date  . Anxiety   . Depression   . GERD (gastroesophageal reflux disease)     Past Surgical History:  Procedure Laterality Date  . ABDOMINAL HYSTERECTOMY    . CESAREAN SECTION    . CHOLECYSTECTOMY      Family History  Problem Relation Age of Onset  . Diabetes Mother   . Hypertension Mother   . Breast cancer Mother 59  . Anuerysm Father 71    Social History   Socioeconomic History  . Marital status: Legally Separated    Spouse name: Not on file  . Number of children: 2  . Years of education: Not on file  . Highest education level: Not on file  Occupational History  . Not on file  Tobacco Use  . Smoking status: Never Smoker  . Smokeless tobacco: Never Used  Vaping Use  . Vaping Use: Never used  Substance and Sexual Activity  . Alcohol use: No    Alcohol/week: 0.0 standard drinks  . Drug use: No  . Sexual activity: Yes    Birth control/protection: Surgical  Other Topics Concern  . Not on file  Social History Narrative  . Not on file   Social Determinants of Health   Financial Resource Strain:   . Difficulty of Paying Living Expenses: Not on file  Food Insecurity:   . Worried About 75 in the Last Year: Not on file  . Ran Out of Food in the Last Year: Not on file  Transportation  Needs:   . Lack of Transportation (Medical): Not on file  . Lack of Transportation (Non-Medical): Not on file  Physical Activity:   . Days of Exercise per Week: Not on file  . Minutes of Exercise per Session: Not on file  Stress:   . Feeling of Stress : Not on file  Social Connections:   . Frequency of Communication with Friends and Family: Not on file  . Frequency of Social Gatherings with Friends and Family: Not on file  . Attends Religious Services: Not on file  . Active Member of Clubs or Organizations: Not on file  . Attends 52 Meetings: Not on file  . Marital Status: Not on file  Intimate Partner Violence:   . Fear of Current or Ex-Partner: Not on file  . Emotionally Abused: Not on file  . Physically Abused: Not on file  . Sexually Abused: Not on file    Outpatient Medications Prior to Visit  Medication Sig Dispense Refill  . OMEPRAZOLE PO Take 20 mg by mouth daily.    Programme researcher, broadcasting/film/video escitalopram (LEXAPRO) 5 MG tablet TAKE 1 TABLET(5 MG) BY MOUTH DAILY 30 tablet 0  . gabapentin (NEURONTIN) 300 MG capsule 1 capsule in morning and bedtime, 1 capsules  in the afternoon as needed for anxiety. (Patient taking differently: Take 300 mg by mouth See admin instructions. 1 capsule in morning and bedtime, 1 capsules in the afternoon as needed for anxiety.) 180 capsule 3  . rizatriptan (MAXALT) 10 MG tablet TAKE 1 TABLET BY MOUTH DAILY MAY REPEAT AFTER 2 HOURS. DO NOT REPEAT UNTIL NEXT DAY. 10 tablet 5  . amoxicillin-clavulanate (AUGMENTIN) 875-125 MG tablet Take 1 tablet by mouth 2 (two) times daily. One po bid x 7 days 14 tablet 0  . ondansetron (ZOFRAN ODT) 4 MG disintegrating tablet Take 1 tablet (4 mg total) by mouth every 8 (eight) hours as needed for nausea or vomiting. (Patient not taking: Reported on 02/26/2020) 10 tablet 0  . sulfamethoxazole-trimethoprim (BACTRIM DS) 800-160 MG tablet Take 1 tablet by mouth 2 (two) times daily. (Patient not taking: Reported on 10/19/2019) 6  tablet 0  . trimethoprim-polymyxin b (POLYTRIM) ophthalmic solution Place 1 drop into the right eye every 4 (four) hours. (Patient not taking: Reported on 10/03/2019) 10 mL 0   No facility-administered medications prior to visit.    Allergies  Allergen Reactions  . Morphine And Related Nausea And Vomiting    ROS Review of Systems Pertinent positives and negatives per hpi     Objective:    Physical Exam Vitals and nursing note reviewed.  Constitutional:      General: She is not in acute distress.    Appearance: Normal appearance. She is normal weight. She is not ill-appearing, toxic-appearing or diaphoretic.  Cardiovascular:     Rate and Rhythm: Normal rate and regular rhythm.     Pulses: Normal pulses.     Heart sounds: Normal heart sounds. No murmur heard.  No friction rub. No gallop.   Pulmonary:     Effort: Pulmonary effort is normal. No respiratory distress.     Breath sounds: Normal breath sounds. No stridor. No wheezing, rhonchi or rales.  Chest:     Chest wall: No tenderness.  Skin:    Capillary Refill: Capillary refill takes less than 2 seconds.  Neurological:     General: No focal deficit present.     Mental Status: She is alert and oriented to person, place, and time. Mental status is at baseline.  Psychiatric:        Mood and Affect: Mood normal.        Behavior: Behavior normal.        Thought Content: Thought content normal.        Judgment: Judgment normal.     BP 115/74   Pulse 76   Temp (!) 97.5 F (36.4 C) (Temporal)   Ht 5\' 9"  (1.753 m)   Wt 120 lb (54.4 kg)   SpO2 96%   BMI 17.72 kg/m  Wt Readings from Last 3 Encounters:  02/26/20 120 lb (54.4 kg)  09/04/19 116 lb (52.6 kg)  05/28/19 130 lb (59 kg)     Health Maintenance Due  Topic Date Due  . Hepatitis C Screening  Never done  . PAP SMEAR-Modifier  Never done  . INFLUENZA VACCINE  01/27/2020    There are no preventive care reminders to display for this patient.  Lab Results    Component Value Date   TSH 1.480 09/04/2019   Lab Results  Component Value Date   WBC 12.6 (H) 10/18/2019   HGB 14.8 10/18/2019   HCT 43.9 10/18/2019   MCV 91.5 10/18/2019   PLT 314 10/18/2019   Lab Results  Component Value  Date   NA 141 10/18/2019   K 4.1 10/18/2019   CO2 21 (L) 10/18/2019   GLUCOSE 137 (H) 10/18/2019   BUN 14 10/18/2019   CREATININE 0.80 10/18/2019   BILITOT 1.3 (H) 10/18/2019   ALKPHOS 65 10/18/2019   AST 25 10/18/2019   ALT 27 10/18/2019   PROT 7.0 10/18/2019   ALBUMIN 4.0 10/18/2019   CALCIUM 9.0 10/18/2019   ANIONGAP 11 10/18/2019   Lab Results  Component Value Date   CHOL 188 09/04/2019   Lab Results  Component Value Date   HDL 58 09/04/2019   Lab Results  Component Value Date   LDLCALC 119 (H) 09/04/2019   Lab Results  Component Value Date   TRIG 60 09/04/2019   Lab Results  Component Value Date   CHOLHDL 3.2 09/04/2019   No results found for: HGBA1C    Assessment & Plan:   Problem List Items Addressed This Visit    None    Visit Diagnoses    GAD (generalized anxiety disorder)       Relevant Medications   escitalopram (LEXAPRO) 5 MG tablet   gabapentin (NEURONTIN) 300 MG capsule   Migraine with aura and without status migrainosus, not intractable       Relevant Medications   escitalopram (LEXAPRO) 5 MG tablet   gabapentin (NEURONTIN) 300 MG capsule   rizatriptan (MAXALT) 10 MG tablet      Meds ordered this encounter  Medications  . escitalopram (LEXAPRO) 5 MG tablet    Sig: TAKE 1 TABLET(5 MG) BY MOUTH DAILY    Dispense:  90 tablet    Refill:  3  . gabapentin (NEURONTIN) 300 MG capsule    Sig: 1 capsule in morning and bedtime, 1 capsules in the afternoon as needed for anxiety.    Dispense:  180 capsule    Refill:  3  . rizatriptan (MAXALT) 10 MG tablet    Sig: TAKE 1 TABLET BY MOUTH DAILY MAY REPEAT AFTER 2 HOURS. DO NOT REPEAT UNTIL NEXT DAY.    Dispense:  90 tablet    Refill:  3    Follow-up: No  follow-ups on file.   PLAN  Refill all meds x 1 year  Encouraged patient to return for CPE and labs  Patient encouraged to call clinic with any questions, comments, or concerns.  Janeece Agee, NP

## 2020-03-31 ENCOUNTER — Encounter: Payer: Self-pay | Admitting: Registered Nurse

## 2020-03-31 NOTE — Telephone Encounter (Signed)
Pt would like to receive antibody treatment is this something we can order?

## 2020-04-01 ENCOUNTER — Telehealth: Payer: Self-pay | Admitting: Physician Assistant

## 2020-04-01 NOTE — Telephone Encounter (Signed)
Called to discuss with patient about Covid symptoms and the use of casirivimab/imdevimab, a monoclonal antibody infusion for those with mild to moderate Covid symptoms and at a high risk of hospitalization. It appears that the patient does not qualify based on lack of FDA approved risk factors.   I left her a message telling her this and gave her my call back # if she has further questions.   Cline Crock PA-C  [facility]

## 2020-05-30 ENCOUNTER — Encounter: Payer: Self-pay | Admitting: Registered Nurse

## 2020-05-30 NOTE — Telephone Encounter (Signed)
Pt would like to increase lexapro slightly please advise

## 2020-06-09 ENCOUNTER — Other Ambulatory Visit: Payer: Self-pay

## 2020-06-09 ENCOUNTER — Ambulatory Visit
Admission: EM | Admit: 2020-06-09 | Discharge: 2020-06-09 | Disposition: A | Discharge status: Home / Self Care | Payer: BC Managed Care – PPO | Attending: Emergency Medicine | Admitting: Emergency Medicine

## 2020-06-09 DIAGNOSIS — N6320 Unspecified lump in the left breast, unspecified quadrant: Secondary | ICD-10-CM | POA: Diagnosis not present

## 2020-06-09 DIAGNOSIS — R079 Chest pain, unspecified: Secondary | ICD-10-CM

## 2020-06-09 NOTE — ED Provider Notes (Signed)
[name]    CSN: [id number] Arrival date & time: 06/09/20  1336      History   Chief Complaint Chief Complaint  Patient presents with  . Chest Pain    X 3 weeks to a month    HPI Rhonda Green is a 49 y.o. female  With history as below presenting for 2-week course of intermittent left-sided chest pain.  No difficulty breathing, palpitations, lightheadedness or dizziness.  States she noticed a quarter size mass in her left upper breast about 4 days ago.  States it is tender.  Family history of breast cancer in her mother: Diagnosed age 40.  Patient underwent mammography that was normal earlier this year.  No change to nipple, appearance of breast.  Past Medical History:  Diagnosis Date  . Anxiety   . Depression   . GERD (gastroesophageal reflux disease)     Patient Active Problem List   Diagnosis Date Noted  . Anxiety 02/05/2015    Past Surgical History:  Procedure Laterality Date  . ABDOMINAL HYSTERECTOMY    . CESAREAN SECTION    . CHOLECYSTECTOMY      OB History   No obstetric history on file.      Home Medications    Prior to Admission medications   Medication Sig Start Date End Date Taking? Authorizing Provider  escitalopram (LEXAPRO) 5 MG tablet TAKE 1 TABLET(5 MG) BY MOUTH DAILY 02/26/20  Yes Janeece Agee, NP  gabapentin (NEURONTIN) 300 MG capsule 1 capsule in morning and bedtime, 1 capsules in the afternoon as needed for anxiety. 02/26/20  Yes Janeece Agee, NP  omeprazole (PRILOSEC) 20 MG capsule Take 1 capsule (20 mg total) by mouth daily. 02/26/20  Yes Janeece Agee, NP  rizatriptan (MAXALT) 10 MG tablet TAKE 1 TABLET BY MOUTH DAILY MAY REPEAT AFTER 2 HOURS. DO NOT REPEAT UNTIL NEXT DAY. 02/26/20  Yes Janeece Agee, NP  metoCLOPramide (REGLAN) 10 MG tablet Take 1 tablet (10 mg total) by mouth every 6 (six) hours as needed for nausea (nausea/headache). 10/19/19 10/19/19  Arthor Captain, PA-C    Family History Family History   Problem Relation Age of Onset  . Diabetes Mother   . Hypertension Mother   . Breast cancer Mother 23  . Anuerysm Father 49    Social History Social History   Tobacco Use  . Smoking status: Never Smoker  . Smokeless tobacco: Never Used  Vaping Use  . Vaping Use: Never used  Substance Use Topics  . Alcohol use: No    Alcohol/week: 0.0 standard drinks  . Drug use: No     Allergies   Morphine and related   Review of Systems Review of Systems  Constitutional: Negative for fatigue and fever.  HENT: Negative for ear pain, sinus pain, sore throat and voice change.   Eyes: Negative for pain, redness and visual disturbance.  Respiratory: Negative for cough and shortness of breath.   Cardiovascular: Positive for chest pain. Negative for palpitations.  Gastrointestinal: Negative for abdominal pain, diarrhea and vomiting.  Musculoskeletal: Negative for arthralgias and myalgias.  Skin: Negative for rash and wound.  Neurological: Negative for syncope and headaches.     Physical Exam Triage Vital Signs ED Triage Vitals  Enc Vitals Group     BP 06/09/20 1354 (!) 148/86     Pulse Rate 06/09/20 1354 89     Resp --      Temp 06/09/20 1354 98 F (36.7 C)     Temp Source 06/09/20  1354 Oral     SpO2 06/09/20 1354 99 %     Weight --      Height --      Head Circumference --      Peak Flow --      Pain Score 06/09/20 1355 6     Pain Loc --      Pain Edu? --      Excl. in GC? --    No data found.  Updated Vital Signs BP (!) 148/86 (BP Location: Left Arm)   Pulse 89   Temp 98 F (36.7 C) (Oral)   SpO2 99%   Visual Acuity Right Eye Distance:   Left Eye Distance:   Bilateral Distance:    Right Eye Near:   Left Eye Near:    Bilateral Near:     Physical Exam Constitutional:      General: She is not in acute distress. HENT:     Head: Normocephalic and atraumatic.  Eyes:     General: No scleral icterus.    Pupils: Pupils are equal, round, and reactive to light.   Cardiovascular:     Rate and Rhythm: Normal rate and regular rhythm.     Heart sounds: Normal heart sounds.  Pulmonary:     Effort: Pulmonary effort is normal. No tachypnea or respiratory distress.     Breath sounds: Normal breath sounds.  Chest:    Skin:    Coloration: Skin is not jaundiced or pale.  Neurological:     Mental Status: She is alert and oriented to person, place, and time.      UC Treatments / Results  Labs (all labs ordered are listed, but only abnormal results are displayed) Labs Reviewed - No data to display  EKG   Radiology No results found.  Procedures Procedures (including critical care time)  Medications Ordered in UC Medications - No data to display  Initial Impression / Assessment and Plan / UC Course  I have reviewed the triage vital signs and the nursing notes.  Pertinent labs & imaging results that were available during my care of the patient were reviewed by me and considered in my medical decision making (see chart for details).     EKG done office, reviewed by me and compared to previous from 08/19/2016: NSR with ventricular 88 bpm no QTC prolongation, or change in waveforms in all leads.  Nonacute.  Overall patient appears well.  No obvious cause of suspected LAD of left breast.  Patient declined full exam as she has appoint with her PCP tomorrow.  Encouraged her to keep that follow-up as ultrasound may be needed.  Return precautions discussed, pt verbalized understanding and is agreeable to plan. Final Clinical Impressions(s) / UC Diagnoses   Final diagnoses:  Left-sided chest pain  Left breast mass     Discharge Instructions     Aleve 440 mg 2 times a day with food. Hot compresses. Keep follow up with PCP to discuss possible [place].    ED Prescriptions    None     PDMP not reviewed this encounter.   Hall[place], [place], [place] 06/09/20 1639

## 2020-06-09 NOTE — Discharge Instructions (Signed)
Aleve 440 mg 2 times a day with food. Hot compresses. Keep follow up with PCP to discuss possible [place].

## 2020-06-09 NOTE — ED Triage Notes (Signed)
Patient states that for about 2 weeks she has had left sided chest pain. She states that about 4 days ago she noticed a lump that is getting bigger on her left breast that she feels is growing. Pt states both are painful. pt isd asox4 and Ambulatory.

## 2020-06-10 ENCOUNTER — Other Ambulatory Visit: Payer: Self-pay

## 2020-06-10 ENCOUNTER — Ambulatory Visit: Payer: BC Managed Care – PPO | Admitting: Registered Nurse

## 2020-06-10 ENCOUNTER — Encounter: Payer: Self-pay | Admitting: Registered Nurse

## 2020-06-10 VITALS — BP 144/86 | HR 84 | Temp 98.8°F | Resp 18 | Ht 69.0 in | Wt 122.0 lb

## 2020-06-10 DIAGNOSIS — N649 Disorder of breast, unspecified: Secondary | ICD-10-CM | POA: Diagnosis not present

## 2020-06-10 DIAGNOSIS — I808 Phlebitis and thrombophlebitis of other sites: Secondary | ICD-10-CM

## 2020-06-10 NOTE — Patient Instructions (Signed)
° ° ° °  If you have lab work done today you will be contacted with your lab results within the next 2 weeks.  If you have not heard from us then please contact us. The fastest way to get your results is to register for My Chart. ° ° °IF you received an x-ray today, you will receive an invoice from [facility]. Please contact [facility] at [phone] with questions or concerns regarding your invoice.  ° °IF you received labwork today, you will receive an invoice from [facility]. Please contact [facility] at [phone] with questions or concerns regarding your invoice.  ° °Our billing staff will not be able to assist you with questions regarding bills from these companies. ° °You will be contacted with the lab results as soon as they are available. The fastest way to get your results is to activate your My Chart account. Instructions are located on the last page of this paperwork. If you have not heard from us regarding the results in 2 weeks, please contact this office. °  ° ° ° °

## 2020-06-10 NOTE — Progress Notes (Signed)
Acute Office Visit  Subjective:    Patient ID: Rhonda Green, female    DOB: [birth year], 49 y.o.   MRN: [id number]  Chief Complaint  Patient presents with   Chest Pain    Patient states she has been  having some chest pain. Per patient it started a few weeks ago now she has some burning on the left side of chests and had developed a lump that has grown. Patient went to the urgent care yesterday and the EKG was normal and patient has some copies.    HPI Patient is in today for breast lesion  Pea size lump started 3 weeks ago Over past 1-2 days is now a linear lesion around 2-3 inches long, extending vertically.  Firm, tender, aching, sharp pain.  No other breast symptoms No other cardiac or respiratory symptoms fam hx of breast ca in mother onset age 77 No skin changes No lymph involvement.  Past Medical History:  Diagnosis Date   Anxiety    Depression    GERD (gastroesophageal reflux disease)     Past Surgical History:  Procedure Laterality Date   ABDOMINAL HYSTERECTOMY     CESAREAN SECTION     CHOLECYSTECTOMY      Family History  Problem Relation Age of Onset   Diabetes Mother    Hypertension Mother    Breast cancer Mother 3   Anuerysm Father 95    Social History   Socioeconomic History   Marital status: Legally Separated    Spouse name: Not on file   Number of children: 2   Years of education: Not on file   Highest education level: Not on file  Occupational History   Not on file  Tobacco Use   Smoking status: Never Smoker   Smokeless tobacco: Never Used  Vaping Use   Vaping Use: Never used  Substance and Sexual Activity   Alcohol use: No    Alcohol/week: 0.0 standard drinks   Drug use: No   Sexual activity: Yes    Birth control/protection: Surgical  Other Topics Concern   Not on file  Social History Narrative   Not on file   Social Determinants of Health   Financial Resource Strain: Not on file  Food Insecurity: Not  on file  Transportation Needs: Not on file  Physical Activity: Not on file  Stress: Not on file  Social Connections: Not on file  Intimate Partner Violence: Not on file    Outpatient Medications Prior to Visit  Medication Sig Dispense Refill   escitalopram (LEXAPRO) 5 MG tablet TAKE 1 TABLET(5 MG) BY MOUTH DAILY 90 tablet 3   gabapentin (NEURONTIN) 300 MG capsule 1 capsule in morning and bedtime, 1 capsules in the afternoon as needed for anxiety. 180 capsule 3   omeprazole (PRILOSEC) 20 MG capsule Take 1 capsule (20 mg total) by mouth daily. 90 capsule 3   rizatriptan (MAXALT) 10 MG tablet TAKE 1 TABLET BY MOUTH DAILY MAY REPEAT AFTER 2 HOURS. DO NOT REPEAT UNTIL NEXT DAY. 90 tablet 3   No facility-administered medications prior to visit.    Allergies  Allergen Reactions   Morphine And Related Nausea And Vomiting    Review of Systems  Constitutional: Negative.   HENT: Negative.   Eyes: Negative.   Respiratory: Negative.   Cardiovascular: Negative.   Gastrointestinal: Negative.   Genitourinary: Negative.   Musculoskeletal: Negative.   Skin: Negative.   Neurological: Negative.   Psychiatric/Behavioral: Negative.   All other systems reviewed and  are negative.      Objective:    Physical Exam Vitals and nursing note reviewed.  Constitutional:      Appearance: She is well-developed.  Cardiovascular:     Rate and Rhythm: Normal rate and regular rhythm.     Heart sounds: Normal heart sounds.  Pulmonary:     Effort: Pulmonary effort is normal.     Breath sounds: Normal breath sounds.  Chest:     Chest wall: Mass (linear lesion at 11 oclock 3 in from left nipple. no skin changes. lesion is firm, cord like, linear) and tenderness present. No deformity, crepitus or edema. There is no dullness to percussion.  Musculoskeletal:        General: Normal range of motion.  Skin:    General: Skin is warm and dry.     Capillary Refill: Capillary refill takes less than 2  seconds.     Coloration: Skin is not cyanotic or pale.     Findings: No ecchymosis, erythema or rash.     Nails: There is no clubbing.  Neurological:     General: No focal deficit present.     Mental Status: She is alert.  Psychiatric:        Mood and Affect: Mood normal. Mood is not anxious.        Behavior: Behavior normal. Behavior is not agitated.     BP (!) 144/86    Pulse 84    Temp 98.8 F (37.1 C) (Temporal)    Resp 18    Ht 5\' 9"  (1.753 m)    Wt 122 lb (55.3 kg)    SpO2 98%    BMI 18.02 kg/m  Wt Readings from Last 3 Encounters:  06/10/20 122 lb (55.3 kg)  02/26/20 120 lb (54.4 kg)  09/04/19 116 lb (52.6 kg)    There are no preventive care reminders to display for this patient.  There are no preventive care reminders to display for this patient.   Lab Results  Component Value Date   TSH 1.480 09/04/2019   Lab Results  Component Value Date   WBC 12.6 (H) 10/18/2019   HGB 14.8 10/18/2019   HCT 43.9 10/18/2019   MCV 91.5 10/18/2019   PLT 314 10/18/2019   Lab Results  Component Value Date   NA 141 10/18/2019   K 4.1 10/18/2019   CO2 21 (L) 10/18/2019   GLUCOSE 137 (H) 10/18/2019   BUN 14 10/18/2019   CREATININE 0.80 10/18/2019   BILITOT 1.3 (H) 10/18/2019   ALKPHOS 65 10/18/2019   AST 25 10/18/2019   ALT 27 10/18/2019   PROT 7.0 10/18/2019   ALBUMIN 4.0 10/18/2019   CALCIUM 9.0 10/18/2019   ANIONGAP 11 10/18/2019   Lab Results  Component Value Date   CHOL 188 09/04/2019   Lab Results  Component Value Date   HDL 58 09/04/2019   Lab Results  Component Value Date   LDLCALC 119 (H) 09/04/2019   Lab Results  Component Value Date   TRIG 60 09/04/2019   Lab Results  Component Value Date   CHOLHDL 3.2 09/04/2019   No results found for: HGBA1C     Assessment & Plan:   Problem List Items Addressed This Visit   None   Visit Diagnoses    Breast lesion    -  Primary   Relevant Orders   11/04/2019 BREAST COMPLETE UNI LEFT INC AXILLA   Mondor's  disease of breast       Relevant  Orders   US BREAST COMPLETE UNI LEFT INC AXILLA       No orders of the defined types were placed in this encounter.  PLAN  Thin, cordlike lesion that occurred so acutely more suspicious for Mondor's Disease than malignancy, some assurance from normal mammography and L breast US in April of this year.  Will order follow up ultrasound to confirm  Monitor for GCA or other complications, as well as other signs of breast ca.  NSAIDs for pain relief  Return prn  Patient encouraged to call clinic with any questions, comments, or concerns.  Janeece Agee, NP

## 2020-07-03 ENCOUNTER — Other Ambulatory Visit: Payer: Self-pay | Admitting: Registered Nurse

## 2020-07-03 DIAGNOSIS — N649 Disorder of breast, unspecified: Secondary | ICD-10-CM

## 2020-07-03 DIAGNOSIS — I808 Phlebitis and thrombophlebitis of other sites: Secondary | ICD-10-CM

## 2020-07-12 ENCOUNTER — Other Ambulatory Visit: Payer: Self-pay

## 2020-07-30 ENCOUNTER — Encounter: Payer: Self-pay | Admitting: Registered Nurse

## 2020-07-30 DIAGNOSIS — F411 Generalized anxiety disorder: Secondary | ICD-10-CM

## 2020-07-30 MED ORDER — ESCITALOPRAM OXALATE 10 MG PO TABS: ORAL_TABLET | ORAL | 3 refills | Status: DC

## 2020-09-24 ENCOUNTER — Telehealth: Payer: Self-pay

## 2020-09-24 NOTE — Telephone Encounter (Signed)
Attempted to call patient to let her know that Rhonda Green was currently on vacation and discuss her possibly going to an urgent care to get evaluated for an UTI to get the proper medication and treatment.

## 2020-11-30 ENCOUNTER — Other Ambulatory Visit: Payer: Self-pay | Admitting: Registered Nurse

## 2020-11-30 DIAGNOSIS — F411 Generalized anxiety disorder: Secondary | ICD-10-CM

## 2020-12-26 ENCOUNTER — Encounter: Payer: Self-pay | Admitting: Registered Nurse

## 2021-01-20 ENCOUNTER — Other Ambulatory Visit: Payer: Self-pay

## 2021-01-20 ENCOUNTER — Ambulatory Visit (INDEPENDENT_AMBULATORY_CARE_PROVIDER_SITE_OTHER): Payer: BC Managed Care – PPO | Admitting: Registered Nurse

## 2021-01-20 ENCOUNTER — Encounter: Payer: Self-pay | Admitting: Registered Nurse

## 2021-01-20 ENCOUNTER — Other Ambulatory Visit (HOSPITAL_COMMUNITY)
Admission: RE | Admit: 2021-01-20 | Discharge: 2021-01-20 | Disposition: A | Discharge status: Home / Self Care | Payer: BC Managed Care – PPO | Source: Ambulatory Visit | Attending: Registered Nurse | Admitting: Registered Nurse

## 2021-01-20 VITALS — BP 137/61 | HR 86 | Temp 98.0°F | Resp 18 | Ht 69.0 in | Wt 137.0 lb

## 2021-01-20 DIAGNOSIS — Z113 Encounter for screening for infections with a predominantly sexual mode of transmission: Secondary | ICD-10-CM | POA: Insufficient documentation

## 2021-01-20 DIAGNOSIS — G43109 Migraine with aura, not intractable, without status migrainosus: Secondary | ICD-10-CM

## 2021-01-20 DIAGNOSIS — Z1329 Encounter for screening for other suspected endocrine disorder: Secondary | ICD-10-CM

## 2021-01-20 DIAGNOSIS — Z130 Encounter for screening for diseases of the blood and blood-forming organs and certain disorders involving the immune mechanism: Secondary | ICD-10-CM | POA: Diagnosis not present

## 2021-01-20 DIAGNOSIS — Z13220 Encounter for screening for lipoid disorders: Secondary | ICD-10-CM | POA: Diagnosis not present

## 2021-01-20 DIAGNOSIS — R350 Frequency of micturition: Secondary | ICD-10-CM | POA: Diagnosis not present

## 2021-01-20 DIAGNOSIS — Z13228 Encounter for screening for other metabolic disorders: Secondary | ICD-10-CM

## 2021-01-20 MED ORDER — NITROFURANTOIN MONOHYD MACRO 100 MG PO CAPS: 100.0000 mg | ORAL_CAPSULE | Freq: Two times a day (BID) | ORAL | 0 refills | Status: AC

## 2021-01-20 MED ORDER — RIZATRIPTAN BENZOATE 10 MG PO TABS: ORAL_TABLET | ORAL | 3 refills | Status: DC

## 2021-01-20 NOTE — Patient Instructions (Addendum)
Ms. Heyward-  Always great to see you.  Labs today should be back soon - I'll let you know if there's anything of concern.  Regarding urinary symptoms: will plan to do STI testing and urine culture for UTI today. Will treat preemptively with Macrobid 100mg  taken twice daily for five days. Hydrate! Ideally around 1/2-3/4 of a gallon daily. Use the restroom when you need (do not delay if possible) and void after every sexual encounter.   If symptoms persist or worsen let me know, we can get you in to see a urologist for interstitial cystitis (now more commonly referred to a Bladder Pain Syndrome).   Thank you  Rich

## 2021-01-21 LAB — CBC WITH DIFFERENTIAL/PLATELET
Basophils Absolute: 0 10*3/uL (ref 0.0–0.1)
Basophils Relative: 0.6 % (ref 0.0–3.0)
Eosinophils Absolute: 0.1 10*3/uL (ref 0.0–0.7)
Eosinophils Relative: 1.2 % (ref 0.0–5.0)
HCT: 40.5 % (ref 36.0–46.0)
Hemoglobin: 13.7 g/dL (ref 12.0–15.0)
Lymphocytes Relative: 22.5 % (ref 12.0–46.0)
Lymphs Abs: 1.7 10*3/uL (ref 0.7–4.0)
MCHC: 33.8 g/dL (ref 30.0–36.0)
MCV: 92.1 fl (ref 78.0–100.0)
Monocytes Absolute: 0.5 10*3/uL (ref 0.1–1.0)
Monocytes Relative: 6.7 % (ref 3.0–12.0)
Neutro Abs: 5.4 10*3/uL (ref 1.4–7.7)
Neutrophils Relative %: 69 % (ref 43.0–77.0)
Platelets: 315 10*3/uL (ref 150.0–400.0)
RBC: 4.4 Mil/uL (ref 3.87–5.11)
RDW: 13.7 % (ref 11.5–15.5)
WBC: 7.8 10*3/uL (ref 4.0–10.5)

## 2021-01-21 LAB — HEMOGLOBIN A1C: Hgb A1c MFr Bld: 5.4 % (ref 4.6–6.5)

## 2021-01-21 LAB — COMPREHENSIVE METABOLIC PANEL
ALT: 8 U/L (ref 0–35)
AST: 14 U/L (ref 0–37)
Albumin: 4.3 g/dL (ref 3.5–5.2)
Alkaline Phosphatase: 72 U/L (ref 39–117)
BUN: 8 mg/dL (ref 6–23)
CO2: 26 mEq/L (ref 19–32)
Calcium: 9 mg/dL (ref 8.4–10.5)
Chloride: 102 mEq/L (ref 96–112)
Creatinine, Ser: 0.74 mg/dL (ref 0.40–1.20)
GFR: 95.65 mL/min (ref >60.00)
Glucose, Bld: 92 mg/dL (ref 70–99)
Potassium: 3.1 mEq/L — ABNORMAL LOW (ref 3.5–5.1)
Sodium: 138 mEq/L (ref 135–145)
Total Bilirubin: 0.8 mg/dL (ref 0.2–1.2)
Total Protein: 6.7 g/dL (ref 6.0–8.3)

## 2021-01-21 LAB — TSH: TSH: 2.2 u[IU]/mL (ref 0.35–5.50)

## 2021-01-21 LAB — LIPID PANEL
Cholesterol: 180 mg/dL (ref 0–200)
HDL: 48.5 mg/dL (ref >39.00)
LDL Cholesterol: 119 mg/dL — ABNORMAL HIGH (ref 0–99)
NonHDL: 131.82
Total CHOL/HDL Ratio: 4
Triglycerides: 62 mg/dL (ref 0.0–149.0)
VLDL: 12.4 mg/dL (ref 0.0–40.0)

## 2021-01-21 NOTE — Progress Notes (Signed)
Established Patient Office Visit  Subjective:  Patient ID: Rhonda Green, female    DOB: 1970/09/29  Age: 50 y.o. MRN: [id number]  CC:  Chief Complaint  Patient presents with   Annual Exam    Patient states she is here for a CPE and discuss UTI and blood found in her urine. PHQ9=8 GAD7=13    HPI Rhonda Green presents for annual exam  Notes urinary frequency and dysuria Hx of UTI, feels similar No flank pain or systemic symptoms  Notes she recently became sexually active again for the first time in a while No vaginal symptoms but would like testing.   Otherwise no acute concerns, feeling well overall.  Does need refill on Maxalt. Works well, no AE. Migraines reduced with this.  Past Medical History:  Diagnosis Date   Anxiety    Depression    GERD (gastroesophageal reflux disease)     Past Surgical History:  Procedure Laterality Date   ABDOMINAL HYSTERECTOMY     CESAREAN SECTION     CHOLECYSTECTOMY      Family History  Problem Relation Age of Onset   Diabetes Mother    Hypertension Mother    Breast cancer Mother [id number]   Anuerysm Father [id number]    Social History   Socioeconomic History   Marital status: Legally Separated    Spouse name: Not on file   Number of children: 2   Years of education: Not on file   Highest education level: Not on file  Occupational History   Not on file  Tobacco Use   Smoking status: Never   Smokeless tobacco: Never  Vaping Use   Vaping Use: Never used  Substance and Sexual Activity   Alcohol use: No    Alcohol/week: 0.0 standard drinks   Drug use: No   Sexual activity: Yes    Birth control/protection: Surgical  Other Topics Concern   Not on file  Social History Narrative   Not on file   Social Determinants of Health   Financial Resource Strain: Not on file  Food Insecurity: Not on file  Transportation Needs: Not on file  Physical Activity: Not on file  Stress: Not on file  Social Connections: Not on file  Intimate  Partner Violence: Not on file    Outpatient Medications Prior to Visit  Medication Sig Dispense Refill   escitalopram (LEXAPRO) 10 MG tablet TAKE 1 TABLET(10 MG) BY MOUTH DAILY 30 tablet 3   gabapentin (NEURONTIN) 300 MG capsule 1 capsule in morning and bedtime, 1 capsules in the afternoon as needed for anxiety. 180 capsule 3   omeprazole (PRILOSEC) 20 MG capsule Take 1 capsule (20 mg total) by mouth daily. 90 capsule 3   rizatriptan (MAXALT) 10 MG tablet TAKE 1 TABLET BY MOUTH DAILY MAY REPEAT AFTER 2 HOURS. DO NOT REPEAT UNTIL NEXT DAY. 90 tablet 3   No facility-administered medications prior to visit.    Allergies  Allergen Reactions   Morphine And Related Nausea And Vomiting    ROS Review of Systems  Constitutional: Negative.   HENT: Negative.    Eyes: Negative.   Respiratory: Negative.    Cardiovascular: Negative.   Gastrointestinal: Negative.   Genitourinary:  Positive for dysuria. Negative for decreased urine volume, difficulty urinating, dyspareunia, enuresis, flank pain, frequency, genital sores, hematuria, menstrual problem, pelvic pain, urgency, vaginal bleeding, vaginal discharge and vaginal pain.  Musculoskeletal: Negative.   Skin: Negative.   Neurological: Negative.   Psychiatric/Behavioral: Negative.    All other systems  reviewed and are negative.    Objective:    Physical Exam Vitals and nursing note reviewed.  Constitutional:      General: She is not in acute distress.    Appearance: Normal appearance. She is normal weight. She is not ill-appearing, toxic-appearing or diaphoretic.  HENT:     Head: Normocephalic and atraumatic.     Right Ear: Tympanic membrane, ear canal and external ear normal. There is no impacted cerumen.     Left Ear: Tympanic membrane, ear canal and external ear normal. There is no impacted cerumen.     Nose: Nose normal. No congestion or rhinorrhea.     Mouth/Throat:     Mouth: Mucous membranes are moist.     Pharynx: Oropharynx  is clear. No oropharyngeal exudate or posterior oropharyngeal erythema.  Eyes:     General: No scleral icterus.       Right eye: No discharge.        Left eye: No discharge.     Extraocular Movements: Extraocular movements intact.     Conjunctiva/sclera: Conjunctivae normal.     Pupils: Pupils are equal, round, and reactive to light.  Cardiovascular:     Rate and Rhythm: Normal rate and regular rhythm.     Pulses: Normal pulses.     Heart sounds: Normal heart sounds. No murmur heard.   No friction rub. No gallop.  Pulmonary:     Effort: Pulmonary effort is normal. No respiratory distress.     Breath sounds: Normal breath sounds. No stridor. No wheezing, rhonchi or rales.  Chest:     Chest wall: No tenderness.  Abdominal:     General: Abdomen is flat. Bowel sounds are normal. There is no distension.     Palpations: Abdomen is soft. There is no mass.     Tenderness: There is no abdominal tenderness. There is no right CVA tenderness, left CVA tenderness, guarding or rebound.     Hernia: No hernia is present.  Musculoskeletal:        General: No swelling, tenderness, deformity or signs of injury. Normal range of motion.     Right lower leg: No edema.     Left lower leg: No edema.  Skin:    General: Skin is warm and dry.     Capillary Refill: Capillary refill takes less than 2 seconds.     Coloration: Skin is not jaundiced or pale.     Findings: No bruising, erythema, lesion or rash.  Neurological:     General: No focal deficit present.     Mental Status: She is alert and oriented to person, place, and time. Mental status is at baseline.     Cranial Nerves: No cranial nerve deficit.     Sensory: No sensory deficit.     Motor: No weakness.     Coordination: Coordination normal.     Gait: Gait normal.     Deep Tendon Reflexes: Reflexes normal.  Psychiatric:        Mood and Affect: Mood normal.        Behavior: Behavior normal.        Thought Content: Thought content normal.         Judgment: Judgment normal.    BP 137/61   Pulse 86   Temp 98 F (36.7 C) (Temporal)   Resp 18   Ht 5\' 9"  (1.753 m)   Wt 137 lb (62.1 kg)   SpO2 99%   BMI 20.23 kg/m  Wt Readings from  Last 3 Encounters:  01/20/21 137 lb (62.1 kg)  06/10/20 122 lb (55.3 kg)  02/26/20 120 lb (54.4 kg)     Health Maintenance Due  Topic Date Due   PAP SMEAR-Modifier  Never done    There are no preventive care reminders to display for this patient.  Lab Results  Component Value Date   TSH 2.20 01/20/2021   Lab Results  Component Value Date   WBC 7.8 01/20/2021   HGB 13.7 01/20/2021   HCT 40.5 01/20/2021   MCV 92.1 01/20/2021   PLT 315.0 01/20/2021   Lab Results  Component Value Date   NA 138 01/20/2021   K 3.1 (L) 01/20/2021   CO2 26 01/20/2021   GLUCOSE 92 01/20/2021   BUN 8 01/20/2021   CREATININE 0.74 01/20/2021   BILITOT 0.8 01/20/2021   ALKPHOS 72 01/20/2021   AST 14 01/20/2021   ALT 8 01/20/2021   PROT 6.7 01/20/2021   ALBUMIN 4.3 01/20/2021   CALCIUM 9.0 01/20/2021   ANIONGAP 11 10/18/2019   GFR 95.65 01/20/2021   Lab Results  Component Value Date   CHOL 180 01/20/2021   Lab Results  Component Value Date   HDL 48.50 01/20/2021   Lab Results  Component Value Date   LDLCALC 119 (H) 01/20/2021   Lab Results  Component Value Date   TRIG 62.0 01/20/2021   Lab Results  Component Value Date   CHOLHDL 4 01/20/2021   Lab Results  Component Value Date   HGBA1C 5.4 01/20/2021      Assessment & Plan:   Problem List Items Addressed This Visit   None Visit Diagnoses     Urinary frequency    -  Primary   Relevant Medications   nitrofurantoin, macrocrystal-monohydrate, (MACROBID) 100 MG capsule   Other Relevant Orders   Urine Culture   Migraine with aura and without status migrainosus, not intractable       Relevant Medications   rizatriptan (MAXALT) 10 MG tablet   Screening for endocrine, metabolic and immunity disorder       Relevant Orders   CBC  with Differential/Platelet (Completed)   Comprehensive metabolic panel (Completed)   Hemoglobin A1c (Completed)   TSH (Completed)   Lipid screening       Relevant Orders   Lipid panel (Completed)   Routine screening for STI (sexually transmitted infection)       Relevant Orders   Urine cytology ancillary only([facility])   HIV antibody (with reflex) (Completed)   RPR (Completed)       Meds ordered this encounter  Medications   rizatriptan (MAXALT) 10 MG tablet    Sig: TAKE 1 TABLET BY MOUTH DAILY MAY REPEAT AFTER 2 HOURS. DO NOT REPEAT UNTIL NEXT DAY.    Dispense:  90 tablet    Refill:  3   nitrofurantoin, macrocrystal-monohydrate, (MACROBID) 100 MG capsule    Sig: Take 1 capsule (100 mg total) by mouth 2 (two) times daily for 5 days.    Dispense:  10 capsule    Refill:  0    Order Specific Question:   Supervising Provider    Answer:   Neva Seat, JEFFREY R [[id number]]    Follow-up: Return in about 1 year (around 01/20/2022) for CPE and labs.   PLAN Unfortunately POCT UA unable to be read due to azo use. Will send macrobid presumptively. Urine culture sent. Sti testing collected. Will follow up as warranted Reviewed UTI prevention with patient Exam unremarkable Labs collected. Will follow  up with the patient as warranted. Patient encouraged to call clinic with any questions, comments, or concerns.  Janeece Agee, NP

## 2021-01-22 LAB — RPR: RPR Ser Ql: NONREACTIVE

## 2021-01-22 LAB — HIV ANTIBODY (ROUTINE TESTING W REFLEX): HIV 1&2 Ab, 4th Generation: NONREACTIVE

## 2021-01-22 LAB — URINE CULTURE
MICRO NUMBER:: 12164345
SPECIMEN QUALITY:: ADEQUATE

## 2021-01-23 LAB — URINE CYTOLOGY ANCILLARY ONLY
Chlamydia: NEGATIVE
Comment: NEGATIVE
Comment: NEGATIVE
Comment: NORMAL
Neisseria Gonorrhea: NEGATIVE
Trichomonas: NEGATIVE

## 2021-02-04 ENCOUNTER — Other Ambulatory Visit: Payer: BC Managed Care – PPO

## 2021-02-04 ENCOUNTER — Other Ambulatory Visit: Payer: Self-pay | Admitting: *Deleted

## 2021-02-04 ENCOUNTER — Other Ambulatory Visit: Payer: Self-pay

## 2021-02-04 DIAGNOSIS — Z13228 Encounter for screening for other metabolic disorders: Secondary | ICD-10-CM

## 2021-02-04 DIAGNOSIS — Z130 Encounter for screening for diseases of the blood and blood-forming organs and certain disorders involving the immune mechanism: Secondary | ICD-10-CM

## 2021-02-04 LAB — COMPREHENSIVE METABOLIC PANEL
ALT: 10 U/L (ref 0–35)
AST: 12 U/L (ref 0–37)
Albumin: 4 g/dL (ref 3.5–5.2)
Alkaline Phosphatase: 74 U/L (ref 39–117)
BUN: 7 mg/dL (ref 6–23)
CO2: 25 mEq/L (ref 19–32)
Calcium: 8.8 mg/dL (ref 8.4–10.5)
Chloride: 101 mEq/L (ref 96–112)
Creatinine, Ser: 0.67 mg/dL (ref 0.40–1.20)
GFR: 103.3 mL/min (ref >60.00)
Glucose, Bld: 90 mg/dL (ref 70–99)
Potassium: 3.9 mEq/L (ref 3.5–5.1)
Sodium: 136 mEq/L (ref 135–145)
Total Bilirubin: 0.5 mg/dL (ref 0.2–1.2)
Total Protein: 6.3 g/dL (ref 6.0–8.3)

## 2021-02-13 ENCOUNTER — Encounter: Payer: Self-pay | Admitting: Registered Nurse

## 2021-03-27 ENCOUNTER — Other Ambulatory Visit: Payer: Self-pay | Admitting: Registered Nurse

## 2021-03-27 DIAGNOSIS — R12 Heartburn: Secondary | ICD-10-CM

## 2021-04-19 NOTE — Telephone Encounter (Signed)
This concern has been previously addressed by myself and/or another provider.  If they patient has ongoing concerns, they can contact me at their convenience.  Thank you,  Rich Peterson Mathey, NP 

## 2021-07-08 ENCOUNTER — Other Ambulatory Visit: Payer: Self-pay

## 2021-07-08 ENCOUNTER — Telehealth (INDEPENDENT_AMBULATORY_CARE_PROVIDER_SITE_OTHER): Payer: BC Managed Care – PPO | Admitting: Registered Nurse

## 2021-07-08 DIAGNOSIS — N3000 Acute cystitis without hematuria: Secondary | ICD-10-CM | POA: Diagnosis not present

## 2021-07-08 DIAGNOSIS — R1114 Bilious vomiting: Secondary | ICD-10-CM

## 2021-07-08 MED ORDER — SULFAMETHOXAZOLE-TRIMETHOPRIM 800-160 MG PO TABS: 1.0000 | ORAL_TABLET | Freq: Two times a day (BID) | ORAL | 0 refills | Status: DC

## 2021-07-08 MED ORDER — ONDANSETRON 4 MG PO TBDP: 4.0000 mg | ORAL_TABLET | Freq: Three times a day (TID) | ORAL | 0 refills | Status: DC | PRN

## 2021-07-08 NOTE — Progress Notes (Signed)
Telemedicine Encounter- SOAP NOTE Established Patient  This telephone encounter was conducted with the patient's (or proxy's) verbal consent via audio telecommunications: yes/no: Yes Patient was instructed to have this encounter in a suitably private space; and to only have persons present to whom they give permission to participate. In addition, patient identity was confirmed by use of name plus two identifiers (DOB and address).  I discussed the limitations, risks, security and privacy concerns of performing an evaluation and management service by telephone and the availability of in person appointments. I also discussed with the patient that there may be a patient responsible charge related to this service. The patient expressed understanding and agreed to proceed.  I spent a total of 13 minutes talking with the patient or their proxy.  Patient at home Provider in office  Participants: Jari Sportsman, NP and Jetty Peeks  Chief Complaint  Patient presents with   Recurrent UTI    Patient states she has a UTI , diarrhea, vomiting and a fever of 103 since Monday.    Subjective   Rhonda Green is a 51 y.o. established patient. Telephone visit today for UTI  HPI Onset Monday Notes she gets these frequently. Often comes with nausea, vomiting, diarrhea, and constipation Flank pain. No lightheadedness, dizziness, chest pain, shob, doe.  Has not been septic in these situations in the past, has managed with oral abx and antiemetics.  Patient Active Problem List   Diagnosis Date Noted   Anxiety 02/05/2015    Past Medical History:  Diagnosis Date   Anxiety    Depression    GERD (gastroesophageal reflux disease)     Current Outpatient Medications  Medication Sig Dispense Refill   escitalopram (LEXAPRO) 10 MG tablet TAKE 1 TABLET(10 MG) BY MOUTH DAILY 30 tablet 3   gabapentin (NEURONTIN) 300 MG capsule 1 capsule in morning and bedtime, 1 capsules in the afternoon as needed for  anxiety. 180 capsule 3   omeprazole (PRILOSEC) 20 MG capsule TAKE 1 CAPSULE(20 MG) BY MOUTH DAILY 90 capsule 3   ondansetron (ZOFRAN-ODT) 4 MG disintegrating tablet Take 1 tablet (4 mg total) by mouth every 8 (eight) hours as needed for nausea or vomiting. 30 tablet 0   rizatriptan (MAXALT) 10 MG tablet TAKE 1 TABLET BY MOUTH DAILY MAY REPEAT AFTER 2 HOURS. DO NOT REPEAT UNTIL NEXT DAY. 90 tablet 3   sulfamethoxazole-trimethoprim (BACTRIM DS) 800-160 MG tablet Take 1 tablet by mouth 2 (two) times daily. 14 tablet 0   No current facility-administered medications for this visit.    Allergies  Allergen Reactions   Morphine And Related Nausea And Vomiting    Social History   Socioeconomic History   Marital status: Legally Separated    Spouse name: Not on file   Number of children: 2   Years of education: Not on file   Highest education level: Not on file  Occupational History   Not on file  Tobacco Use   Smoking status: Never   Smokeless tobacco: Never  Vaping Use   Vaping Use: Never used  Substance and Sexual Activity   Alcohol use: No    Alcohol/week: 0.0 standard drinks   Drug use: No   Sexual activity: Yes    Birth control/protection: Surgical  Other Topics Concern   Not on file  Social History Narrative   Not on file   Social Determinants of Health   Financial Resource Strain: Not on file  Food Insecurity: Not on file  Transportation Needs:  Not on file  Physical Activity: Not on file  Stress: Not on file  Social Connections: Not on file  Intimate Partner Violence: Not on file    Review of Systems  Constitutional: Negative.   HENT: Negative.    Eyes: Negative.   Respiratory: Negative.    Cardiovascular: Negative.   Gastrointestinal:  Positive for diarrhea, nausea and vomiting. Negative for abdominal pain, blood in stool, constipation, heartburn and melena.  Genitourinary:  Positive for flank pain, frequency and urgency.  Skin: Negative.   Neurological:  Negative.   Endo/Heme/Allergies: Negative.   Psychiatric/Behavioral: Negative.    All other systems reviewed and are negative.  Objective   Vitals as reported by the patient: There were no vitals filed for this visit.  Marguetta was seen today for recurrent uti.  Diagnoses and all orders for this visit:  Acute cystitis without hematuria -     sulfamethoxazole-trimethoprim (BACTRIM DS) 800-160 MG tablet; Take 1 tablet by mouth 2 (two) times daily.  Bilious vomiting with nausea -     ondansetron (ZOFRAN-ODT) 4 MG disintegrating tablet; Take 1 tablet (4 mg total) by mouth every 8 (eight) hours as needed for nausea or vomiting.    PLAN Bactrim po bid x 7 days.  Zofran sublingual q8h prn Tylenol 1000mg  po tid, azo prn If no improvement in 1-2 days, call clinic If worsening, low threshold for ER presentation with risk for sepsis Patient encouraged to call clinic with any questions, comments, or concerns.  I discussed the assessment and treatment plan with the patient. The patient was provided an opportunity to ask questions and all were answered. The patient agreed with the plan and demonstrated an understanding of the instructions.   The patient was advised to call back or seek an in-person evaluation if the symptoms worsen or if the condition fails to improve as anticipated.  I provided 14 minutes of non-face-to-face time during this encounter.  Maximiano Coss, NP

## 2021-07-08 NOTE — Patient Instructions (Signed)
° ° ° °  If you have lab work done today you will be contacted with your lab results within the next 2 weeks.  If you have not heard from us then please contact us. The fastest way to get your results is to register for My Chart. ° ° °IF you received an x-ray today, you will receive an invoice from [facility]. Please contact [facility] at [phone] with questions or concerns regarding your invoice.  ° °IF you received labwork today, you will receive an invoice from [facility]. Please contact [facility] at [phone] with questions or concerns regarding your invoice.  ° °Our billing staff will not be able to assist you with questions regarding bills from these companies. ° °You will be contacted with the lab results as soon as they are available. The fastest way to get your results is to activate your My Chart account. Instructions are located on the last page of this paperwork. If you have not heard from us regarding the results in 2 weeks, please contact this office. °  ° ° ° °

## 2021-07-10 ENCOUNTER — Telehealth: Payer: Self-pay | Admitting: Registered Nurse

## 2021-07-10 ENCOUNTER — Other Ambulatory Visit: Payer: Self-pay

## 2021-07-10 ENCOUNTER — Encounter (HOSPITAL_BASED_OUTPATIENT_CLINIC_OR_DEPARTMENT_OTHER): Payer: Self-pay | Admitting: Emergency Medicine

## 2021-07-10 ENCOUNTER — Emergency Department (HOSPITAL_BASED_OUTPATIENT_CLINIC_OR_DEPARTMENT_OTHER)
Admission: EM | Admit: 2021-07-10 | Discharge: 2021-07-10 | Disposition: A | Discharge status: Home / Self Care | Payer: BC Managed Care – PPO | Attending: Emergency Medicine | Admitting: Emergency Medicine

## 2021-07-10 ENCOUNTER — Encounter (HOSPITAL_BASED_OUTPATIENT_CLINIC_OR_DEPARTMENT_OTHER): Payer: Self-pay

## 2021-07-10 DIAGNOSIS — D72819 Decreased white blood cell count, unspecified: Secondary | ICD-10-CM | POA: Insufficient documentation

## 2021-07-10 DIAGNOSIS — U071 COVID-19: Secondary | ICD-10-CM | POA: Insufficient documentation

## 2021-07-10 DIAGNOSIS — E876 Hypokalemia: Secondary | ICD-10-CM | POA: Insufficient documentation

## 2021-07-10 DIAGNOSIS — R509 Fever, unspecified: Secondary | ICD-10-CM | POA: Insufficient documentation

## 2021-07-10 DIAGNOSIS — Z79899 Other long term (current) drug therapy: Secondary | ICD-10-CM | POA: Diagnosis not present

## 2021-07-10 DIAGNOSIS — K529 Noninfective gastroenteritis and colitis, unspecified: Secondary | ICD-10-CM | POA: Insufficient documentation

## 2021-07-10 DIAGNOSIS — R112 Nausea with vomiting, unspecified: Secondary | ICD-10-CM | POA: Insufficient documentation

## 2021-07-10 DIAGNOSIS — R109 Unspecified abdominal pain: Secondary | ICD-10-CM | POA: Diagnosis not present

## 2021-07-10 LAB — URINALYSIS, ROUTINE W REFLEX MICROSCOPIC
Bilirubin Urine: NEGATIVE
Glucose, UA: NEGATIVE mg/dL
Ketones, ur: 15 mg/dL — AB
Leukocytes,Ua: NEGATIVE
Nitrite: NEGATIVE
Protein, ur: 30 mg/dL — AB
Specific Gravity, Urine: 1.029 (ref 1.005–1.030)
pH: 6 (ref 5.0–8.0)

## 2021-07-10 LAB — COMPREHENSIVE METABOLIC PANEL
ALT: 16 U/L (ref 0–44)
AST: 21 U/L (ref 15–41)
Albumin: 4.3 g/dL (ref 3.5–5.0)
Alkaline Phosphatase: 68 U/L (ref 38–126)
Anion gap: 12 (ref 5–15)
BUN: 10 mg/dL (ref 6–20)
CO2: 22 mmol/L (ref 22–32)
Calcium: 9 mg/dL (ref 8.9–10.3)
Chloride: 103 mmol/L (ref 98–111)
Creatinine, Ser: 0.87 mg/dL (ref 0.44–1.00)
GFR, Estimated: >60 mL/min (ref >60)
Glucose, Bld: 114 mg/dL — ABNORMAL HIGH (ref 70–99)
Potassium: 3.3 mmol/L — ABNORMAL LOW (ref 3.5–5.1)
Sodium: 137 mmol/L (ref 135–145)
Total Bilirubin: 0.3 mg/dL (ref 0.3–1.2)
Total Protein: 7.1 g/dL (ref 6.5–8.1)

## 2021-07-10 LAB — RAPID URINE DRUG SCREEN, HOSP PERFORMED
Amphetamines: NOT DETECTED
Barbiturates: NOT DETECTED
Benzodiazepines: NOT DETECTED
Cocaine: NOT DETECTED
Opiates: NOT DETECTED
Tetrahydrocannabinol: POSITIVE — AB

## 2021-07-10 LAB — CBC
HCT: 42.8 % (ref 36.0–46.0)
Hemoglobin: 14.5 g/dL (ref 12.0–15.0)
MCH: 29.9 pg (ref 26.0–34.0)
MCHC: 33.9 g/dL (ref 30.0–36.0)
MCV: 88.2 fL (ref 80.0–100.0)
Platelets: 290 10*3/uL (ref 150–400)
RBC: 4.85 MIL/uL (ref 3.87–5.11)
RDW: 12.7 % (ref 11.5–15.5)
WBC: 3.6 10*3/uL — ABNORMAL LOW (ref 4.0–10.5)
nRBC: 0 % (ref 0.0–0.2)

## 2021-07-10 LAB — LIPASE, BLOOD: Lipase: <10 U/L — ABNORMAL LOW (ref 11–51)

## 2021-07-10 LAB — PREGNANCY, URINE: Preg Test, Ur: NEGATIVE

## 2021-07-10 MED ORDER — SODIUM CHLORIDE 0.9 % IV BOLUS
1000.0000 mL | Freq: Once | INTRAVENOUS | Status: AC
  Administered 2021-07-10: 1000 mL via INTRAVENOUS

## 2021-07-10 MED ORDER — ONDANSETRON HCL 4 MG/2ML IJ SOLN
4.0000 mg | Freq: Once | INTRAMUSCULAR | Status: AC
  Administered 2021-07-10: 4 mg via INTRAVENOUS
  Filled 2021-07-10: qty 2

## 2021-07-10 NOTE — Telephone Encounter (Signed)
Pt called in stating that she isn't doing any better. She states that at her visit with Luan Pulling he said she may need to do IV antibiotics. She wanted to know how does she go about doing this. Pt would like to go to [facility] if possible.  Please advise

## 2021-07-10 NOTE — ED Provider Notes (Signed)
Rhonda Green   CSN: [id number] Arrival date & time: 07/10/21  1215     History  Chief Complaint  Patient presents with   Emesis    Rhonda Green is a 51 y.o. female.   Emesis Associated symptoms: abdominal pain, chills and fever    This is a 51 year old female presenting due to nausea and vomiting x3 days.  She reports she has been having intermittent episodes of diarrhea as well.  In the past this has been contributable to UTIs, she had a telemedicine visit with her primary care doctor 2 days ago and was given Zofran and Bactrim empirically for suspected UTI.  Patient reports she has been feeling febrile and having night sweats.  Reports the vomiting has improved with Zofran but she is still feeling nauseated and unwell.    Not have any specific dysuria but her urine looks darker than normal.  No sick contacts.  No new restaurants.  Past surgical history is notable for 2 prior C-sections and cholecystectomy.  Home Medications Prior to Admission medications   Medication Sig Start Date End Date Taking? Authorizing Provider  escitalopram (LEXAPRO) 10 MG tablet TAKE 1 TABLET(10 MG) BY MOUTH DAILY 11/30/20   Maximiano Coss, NP  gabapentin (NEURONTIN) 300 MG capsule 1 capsule in morning and bedtime, 1 capsules in the afternoon as needed for anxiety. 02/26/20   Maximiano Coss, NP  omeprazole (PRILOSEC) 20 MG capsule TAKE 1 CAPSULE(20 MG) BY MOUTH DAILY 03/30/21   Maximiano Coss, NP  ondansetron (ZOFRAN-ODT) 4 MG disintegrating tablet Take 1 tablet (4 mg total) by mouth every 8 (eight) hours as needed for nausea or vomiting. 07/08/21   Maximiano Coss, NP  rizatriptan (MAXALT) 10 MG tablet TAKE 1 TABLET BY MOUTH DAILY MAY REPEAT AFTER 2 HOURS. DO NOT REPEAT UNTIL NEXT DAY. 01/20/21   Maximiano Coss, NP  sulfamethoxazole-trimethoprim (BACTRIM DS) 800-160 MG tablet Take 1 tablet by mouth 2 (two) times daily. 07/08/21   Maximiano Coss, NP   metoCLOPramide (REGLAN) 10 MG tablet Take 1 tablet (10 mg total) by mouth every 6 (six) hours as needed for nausea (nausea/headache). 10/19/19 10/19/19  Margarita Mail, PA-C      Allergies    Morphine and related    Review of Systems   Review of Systems  Constitutional:  Positive for chills and fever.  Respiratory:  Negative for shortness of breath.   Cardiovascular:  Negative for chest pain.  Gastrointestinal:  Positive for abdominal pain, nausea and vomiting.  Genitourinary:  Negative for difficulty urinating, dysuria, flank pain, frequency, hematuria, pelvic pain, vaginal bleeding and vaginal discharge.  Musculoskeletal:  Negative for gait problem.  Allergic/Immunologic: Negative for immunocompromised state.  Neurological:  Negative for syncope.   Physical Exam Updated Vital Signs BP 120/73 (BP Location: Right Arm)    Pulse 65    Temp 97.6 F (36.4 C) (Oral)    Resp 16    Ht 5\' 9"  (1.753 m)    Wt 63.5 kg    SpO2 99%    BMI 20.67 kg/m  Physical Exam Vitals and nursing Green reviewed. Exam conducted with a chaperone present.  Constitutional:      Appearance: Normal appearance.  HENT:     Head: Normocephalic and atraumatic.     Mouth/Throat:     Mouth: Mucous membranes are dry.  Eyes:     General: No scleral icterus.       Right eye: No discharge.        Left  eye: No discharge.     Extraocular Movements: Extraocular movements intact.     Pupils: Pupils are equal, round, and reactive to light.  Cardiovascular:     Rate and Rhythm: Normal rate and regular rhythm.     Pulses: Normal pulses.     Heart sounds: Normal heart sounds. No murmur heard.   No friction rub. No gallop.  Pulmonary:     Effort: Pulmonary effort is normal. No respiratory distress.     Breath sounds: Normal breath sounds.  Abdominal:     General: Abdomen is flat. Bowel sounds are normal. There is no distension.     Palpations: Abdomen is soft.     Tenderness: There is abdominal tenderness.     Comments:  Abdomen is soft, suprapubic and epigastric tenderness.  There is no rigidity or guarding, no CVA tenderness.  Skin:    General: Skin is warm and dry.     Coloration: Skin is not jaundiced.  Neurological:     Mental Status: She is alert. Mental status is at baseline.     Coordination: Coordination normal.   ED Results / Procedures / Treatments   Labs (all labs ordered are listed, but only abnormal results are displayed) Labs Reviewed  LIPASE, BLOOD - Abnormal; Notable for the following components:      Result Value   Lipase <10 (*)    All other components within normal limits  COMPREHENSIVE METABOLIC PANEL - Abnormal; Notable for the following components:   Potassium 3.3 (*)    Glucose, Bld 114 (*)    All other components within normal limits  CBC - Abnormal; Notable for the following components:   WBC 3.6 (*)    All other components within normal limits  URINALYSIS, ROUTINE W REFLEX MICROSCOPIC - Abnormal; Notable for the following components:   APPearance HAZY (*)    Hgb urine dipstick TRACE (*)    Ketones, ur 15 (*)    Protein, ur 30 (*)    Bacteria, UA MANY (*)    All other components within normal limits  RAPID URINE DRUG SCREEN, HOSP PERFORMED - Abnormal; Notable for the following components:   Tetrahydrocannabinol POSITIVE (*)    All other components within normal limits  PREGNANCY, URINE    EKG None  Radiology No results found.  Procedures Procedures    Medications Ordered in ED Medications  sodium chloride 0.9 % bolus 1,000 mL (0 mLs Intravenous Stopped 07/10/21 1442)  ondansetron (ZOFRAN) injection 4 mg (4 mg Intravenous Given 07/10/21 1330)    ED Course/ Medical Decision Making/ A&P                           Medical Decision Making  This is a 51 year old female presenting due to fevers and nausea and vomiting x3 days.  Her vitals are stable, she does appear dry on exam.  There is no focal abdominal tenderness, abdomen is soft without any rigidity or  guarding.    Given the no focal tenderness or peritoneal signs I do not suspect an acute abdomen.  Will check abdominal labs and give fluid and nausea medicine for now.  Do not think imaging would be helpful given there is no focal tenderness.  Additional history obtained: -Additional history obtained from I reviewed her telehealth visit from her PCP done a few days ago, also reviewed her past medical history on chart review.  History is obtained primarily from the patient, no one else  is inside the room. -External records from outside source obtained and reviewed including: Chart review including previous notes, labs, imaging, consultation notes   Lab Tests: -I ordered, reviewed, and interpreted labs.  The pertinent results include:   CBC shows a slight leukopenia but no anemia. CMP: Slightly hypokalemic but not clinically pertinent.  There is no gross electrolyte derangement or AKI from dehydration or fluid loss. Lipase: Low, not consistent with a pancreatitis presentation. Urine: Patient is not pregnant, UA without any nitrites or leukocytes is not consistent with UTI or pyelonephritis.  There is proteinuria which is consistent with dehydration, IV fluids initiated.  UDS is notable for THC.  Patient has been having cyclic episodes of these, could be consistent with cannabis hyperemesis especially in the absence of UTI or focal findings.   Medicines ordered and prescription drug management: -I ordered medication including Zofran  for nausea. NS for fluid loss.   -Reevaluation of the patient after these medicines showed that the patient improved -I have reviewed the patients home medicines and have made adjustments as needed  ED Course: Serial abdominal exams benign. Patient symptoms improved with Zofran and nausea.  No evidence of UTI, patient is able to tolerate oral intake and passed p.o. challenge.  I do not feel she needs imaging given there is no focal findings, also do not think she  needs hospital admission.  Her vitals are stable, there is no gross electrolyte derangement requiring replenishing.  I discussed the patient's results with her, she is in agreement.  We also discussed the finding of THC in her urine, she is agreeable to 6 months of abstinence from marijuana to see if this improves the cyclic episodes of vomiting.  Reevaluation: After the interventions noted above, I reevaluated the patient and found that they have :improved   Dispostion: Patient discharged in stable condition.  Patient has Zofran at home, declined refill.  Advised to follow-up with PCP and to abstain from marijuana.  Return precautions discussed.         Final Clinical Impression(s) / ED Diagnoses Final diagnoses:  Nausea and vomiting, unspecified vomiting type    Rx / DC Orders ED Discharge Orders     None         Sherrill Raring, PA-C 07/10/21 1557    Charlesetta Shanks, MD 07/11/21 1333

## 2021-07-10 NOTE — Telephone Encounter (Signed)
Can go to ER  Thanks,  Luan Pulling

## 2021-07-10 NOTE — ED Notes (Signed)
Pt provided discharge instructions and prescription information. Pt was given the opportunity to ask questions and questions were answered. Discharge signature not obtained in the setting of the COVID-19 pandemic in order to reduce high touch surfaces.   No episodes ov nausea or vomiting at time of discharge.

## 2021-07-10 NOTE — Telephone Encounter (Signed)
Pt asking about how to get IV abx, wants to go to [facility]. Can she just go to the Urgent care/emergency center and request this or it there other actions that need to precede this?  Please advise

## 2021-07-10 NOTE — Telephone Encounter (Signed)
Called pt and informed her she stated she will go now

## 2021-07-10 NOTE — ED Notes (Signed)
Given  ginger ale  to  drink

## 2021-07-10 NOTE — ED Triage Notes (Signed)
Pt reports n/v since Tuesday. Pt had televisit Thursday and started on abx and nausea meds for possible UTI. Told to come for IV abx if symptoms did not improve. Pt continues waking up in cold sweats, chills.

## 2021-07-10 NOTE — ED Triage Notes (Signed)
Pt seen here earlier today for N/V and is currently being treated for a UTI since Wednesday. Pt is back today for a follow up because she reports she is having ShOB, cold chills,and diaphoresis. Pt has not vomited since previous discharge.

## 2021-07-10 NOTE — Discharge Instructions (Addendum)
Your work-up today was reassuring.  Continue taking the Zofran as needed for nausea.  You can continue taking the antibiotics if you desire, there is no signs of a urinary tract infection in your urine today.  Continue drinking plenty of fluids, stick to a plain diet.  I would avoid smoking marijuana for the next 6 months to see if this improves these cyclic episodes of vomiting.

## 2021-07-11 ENCOUNTER — Emergency Department (HOSPITAL_BASED_OUTPATIENT_CLINIC_OR_DEPARTMENT_OTHER): Payer: BC Managed Care – PPO

## 2021-07-11 ENCOUNTER — Emergency Department (HOSPITAL_BASED_OUTPATIENT_CLINIC_OR_DEPARTMENT_OTHER)
Admission: EM | Admit: 2021-07-11 | Discharge: 2021-07-11 | Disposition: A | Discharge status: Home / Self Care | Payer: BC Managed Care – PPO | Source: Home / Self Care | Attending: Emergency Medicine | Admitting: Emergency Medicine

## 2021-07-11 ENCOUNTER — Emergency Department (HOSPITAL_BASED_OUTPATIENT_CLINIC_OR_DEPARTMENT_OTHER): Payer: BC Managed Care – PPO | Admitting: Radiology

## 2021-07-11 DIAGNOSIS — U071 COVID-19: Secondary | ICD-10-CM

## 2021-07-11 DIAGNOSIS — K529 Noninfective gastroenteritis and colitis, unspecified: Secondary | ICD-10-CM

## 2021-07-11 DIAGNOSIS — R112 Nausea with vomiting, unspecified: Secondary | ICD-10-CM

## 2021-07-11 LAB — COMPREHENSIVE METABOLIC PANEL
ALT: 19 U/L (ref 0–44)
AST: 24 U/L (ref 15–41)
Albumin: 4 g/dL (ref 3.5–5.0)
Alkaline Phosphatase: 55 U/L (ref 38–126)
Anion gap: 10 (ref 5–15)
BUN: 10 mg/dL (ref 6–20)
CO2: 21 mmol/L — ABNORMAL LOW (ref 22–32)
Calcium: 8.6 mg/dL — ABNORMAL LOW (ref 8.9–10.3)
Chloride: 108 mmol/L (ref 98–111)
Creatinine, Ser: 0.75 mg/dL (ref 0.44–1.00)
GFR, Estimated: >60 mL/min (ref >60)
Glucose, Bld: 108 mg/dL — ABNORMAL HIGH (ref 70–99)
Potassium: 3.5 mmol/L (ref 3.5–5.1)
Sodium: 139 mmol/L (ref 135–145)
Total Bilirubin: 0.4 mg/dL (ref 0.3–1.2)
Total Protein: 6.7 g/dL (ref 6.5–8.1)

## 2021-07-11 LAB — CBC WITH DIFFERENTIAL/PLATELET
Abs Immature Granulocytes: 0.01 10*3/uL (ref 0.00–0.07)
Basophils Absolute: 0 10*3/uL (ref 0.0–0.1)
Basophils Relative: 1 %
Eosinophils Absolute: 0 10*3/uL (ref 0.0–0.5)
Eosinophils Relative: 0 %
HCT: 39.8 % (ref 36.0–46.0)
Hemoglobin: 13.8 g/dL (ref 12.0–15.0)
Immature Granulocytes: 0 %
Lymphocytes Relative: 28 %
Lymphs Abs: 1.3 10*3/uL (ref 0.7–4.0)
MCH: 30.7 pg (ref 26.0–34.0)
MCHC: 34.7 g/dL (ref 30.0–36.0)
MCV: 88.4 fL (ref 80.0–100.0)
Monocytes Absolute: 0.5 10*3/uL (ref 0.1–1.0)
Monocytes Relative: 10 %
Neutro Abs: 2.8 10*3/uL (ref 1.7–7.7)
Neutrophils Relative %: 61 %
Platelets: 277 10*3/uL (ref 150–400)
RBC: 4.5 MIL/uL (ref 3.87–5.11)
RDW: 12.7 % (ref 11.5–15.5)
WBC: 4.6 10*3/uL (ref 4.0–10.5)
nRBC: 0 % (ref 0.0–0.2)

## 2021-07-11 LAB — LACTIC ACID, PLASMA: Lactic Acid, Venous: 0.9 mmol/L (ref 0.5–1.9)

## 2021-07-11 LAB — RESP PANEL BY RT-PCR (FLU A&B, COVID) ARPGX2
Influenza A by PCR: NEGATIVE
Influenza B by PCR: NEGATIVE
SARS Coronavirus 2 by RT PCR: POSITIVE — AB

## 2021-07-11 MED ORDER — AMOXICILLIN-POT CLAVULANATE 875-125 MG PO TABS: 1.0000 | ORAL_TABLET | Freq: Two times a day (BID) | ORAL | 0 refills | Status: DC

## 2021-07-11 MED ORDER — ONDANSETRON HCL 4 MG/2ML IJ SOLN
4.0000 mg | Freq: Once | INTRAMUSCULAR | Status: AC
Start: 2021-07-11 — End: 2021-07-11
  Administered 2021-07-11: 4 mg via INTRAVENOUS
  Filled 2021-07-11: qty 2

## 2021-07-11 MED ORDER — IOHEXOL 300 MG/ML  SOLN
100.0000 mL | Freq: Once | INTRAMUSCULAR | Status: AC | PRN
  Administered 2021-07-11: 100 mL via INTRAVENOUS

## 2021-07-11 MED ORDER — NIRMATRELVIR/RITONAVIR (PAXLOVID)TABLET: 3.0000 | ORAL_TABLET | Freq: Two times a day (BID) | ORAL | 0 refills | Status: AC

## 2021-07-11 MED ORDER — ACETAMINOPHEN 500 MG PO TABS
1000.0000 mg | ORAL_TABLET | Freq: Once | ORAL | Status: AC
  Administered 2021-07-11: 1000 mg via ORAL
  Filled 2021-07-11: qty 2

## 2021-07-11 MED ORDER — SODIUM CHLORIDE 0.9 % IV BOLUS
1000.0000 mL | Freq: Once | INTRAVENOUS | Status: AC
  Administered 2021-07-11: 1000 mL via INTRAVENOUS

## 2021-07-11 NOTE — ED Provider Notes (Signed)
Emergency Department Provider Note   I have reviewed the triage vital signs and the nursing notes.   HISTORY  Chief Complaint Follow-up   HPI Rhonda Green is a 51 y.o. female with PMH reviewed below returns to the ED with worsening fatigue, nausea, chills, and right flank discomfort. She was seen earlier in this ED for nausea and vomiting for the last 3 days. She had been treated empirically with bactrim for a presumed UTI after a tele-health visit. Reports fever at home as high as 103 F. No severe dysuria, hesitancy, or urgency. No CP. She continued to have symptoms at home with diaphoresis and SOB symptoms developing which prompted her return to the ED. No CP, pleuritic or otherwise. Denies severe diarrhea. No bloody emesis.    Past Medical History:  Diagnosis Date   Anxiety    Depression    GERD (gastroesophageal reflux disease)     Review of Systems  Constitutional: Positive fever/chills Eyes: No visual changes. ENT: No sore throat. Cardiovascular: Denies chest pain. Respiratory: Positive shortness of breath. Gastrointestinal: Right flank/abdominal pain. Positive nausea and vomiting.  No diarrhea.  No constipation. Genitourinary: Negative for dysuria. Musculoskeletal: Positive for back pain. Skin: Negative for rash. Neurological: Negative for headaches, focal weakness or numbness.  10-point ROS otherwise negative.  ____________________________________________   PHYSICAL EXAM:  VITAL SIGNS: ED Triage Vitals  Enc Vitals Group     BP 07/10/21 2206 123/82     Pulse Rate 07/10/21 2206 88     Resp 07/10/21 2206 20     Temp 07/10/21 2206 98 F (36.7 C)     Temp Source 07/10/21 2206 Oral     SpO2 07/10/21 2206 100 %     Weight 07/10/21 2207 140 lb (63.5 kg)     Height 07/10/21 2207 5\' 9"  (1.753 m)   Constitutional: Alert and oriented. Appears overall well but low energy/fatigue.  Eyes: Conjunctivae are normal.  Head: Atraumatic. Nose: Mild  congestion/rhinnorhea. Mouth/Throat: Mucous membranes are moist.  Oropharynx non-erythematous. Neck: No stridor.   Cardiovascular: Normal rate, regular rhythm. Good peripheral circulation. Grossly normal heart sounds.   Respiratory: Normal respiratory effort.  No retractions. Lungs CTAB. Gastrointestinal: Soft and nontender. No distention. No CVA tenderness.  Musculoskeletal: No lower extremity tenderness nor edema. No gross deformities of extremities. Neurologic:  Normal speech and language. No gross focal neurologic deficits are appreciated.  Skin:  Skin is warm, dry and intact. No rash noted.   ____________________________________________   LABS (all labs ordered are listed, but only abnormal results are displayed)  Labs Reviewed  RESP PANEL BY RT-PCR (FLU A&B, COVID) ARPGX2 - Abnormal; Notable for the following components:      Result Value   SARS Coronavirus 2 by RT PCR POSITIVE (*)    All other components within normal limits  COMPREHENSIVE METABOLIC PANEL - Abnormal; Notable for the following components:   CO2 21 (*)    Glucose, Bld 108 (*)    Calcium 8.6 (*)    All other components within normal limits  CULTURE, BLOOD (ROUTINE X 2)  CULTURE, BLOOD (ROUTINE X 2)  URINE CULTURE  LACTIC ACID, PLASMA  CBC WITH DIFFERENTIAL/PLATELET  LACTIC ACID, PLASMA   ____________________________________________  RADIOLOGY  DG Chest 2 View  Result Date: 07/11/2021 CLINICAL DATA:  Shortness of breath diagnosed with UTI today. EXAM: CHEST - 2 VIEW COMPARISON:  PA Lat 06/22/2016, chest CT 06/23/2016 FINDINGS: The heart size and mediastinal contours are within normal limits. Both lungs are hyperinflated  but clear. The visualized skeletal structures are unremarkable. IMPRESSION: No active cardiopulmonary disease.  Hyperinflated chest. Electronically Signed   By: Telford Nab M.D.   On: 07/11/2021 02:04   CT ABDOMEN PELVIS W CONTRAST  Result Date: 07/11/2021 CLINICAL DATA:  Currently  being treated for UTI with shortness of breath, chills and diaphoresis. EXAM: CT ABDOMEN AND PELVIS WITH CONTRAST TECHNIQUE: Multidetector CT imaging of the abdomen and pelvis was performed using the standard protocol following bolus administration of intravenous contrast. RADIATION DOSE REDUCTION: This exam was performed according to the departmental dose-optimization program which includes automated exposure control, adjustment of the mA and/or kV according to patient size and/or use of iterative reconstruction technique. CONTRAST:  169mL OMNIPAQUE IOHEXOL 300 MG/ML  SOLN COMPARISON:  October 19, 2019 FINDINGS: Lower chest: No acute abnormality. Hepatobiliary: No focal liver abnormality is seen. Status post cholecystectomy. No biliary dilatation. Pancreas: Unremarkable. No pancreatic ductal dilatation or surrounding inflammatory changes. Spleen: Normal in size without focal abnormality. Adrenals/Urinary Tract: Adrenal glands are unremarkable. Kidneys are normal, without renal calculi or hydronephrosis. A 1.3 cm simple cyst is seen within the posterior aspect of the mid right kidney. Bladder is unremarkable. Stomach/Bowel: Stomach is within normal limits. Appendix appears normal. No evidence of bowel dilatation. Moderately thickened, poorly distended ascending and proximal to mid transverse colon is seen. Numerous diverticula are seen within the descending and sigmoid colon. This area of large bowel is also poorly distended. Vascular/Lymphatic: Very mild aortic atherosclerosis. No enlarged abdominal or pelvic lymph nodes. Multiple surgical clips are seen along the pelvic lymph node chains, bilaterally. Reproductive: Status post hysterectomy. No adnexal masses. Other: No abdominal wall hernia or abnormality. No abdominopelvic ascites. Musculoskeletal: No acute or significant osseous findings. IMPRESSION: 1. Findings, as described above, suggestive of inflammatory or infectious colitis involving non distended ascending  and transverse colon. 2. Colonic diverticulosis. 3. Evidence of prior cholecystectomy. Aortic Atherosclerosis (ICD10-I70.0). Electronically Signed   By: Virgina Norfolk M.D.   On: 07/11/2021 02:10    ____________________________________________   PROCEDURES  Procedure(s) performed:   Procedures  None  ____________________________________________   INITIAL IMPRESSION / ASSESSMENT AND PLAN / ED COURSE  Pertinent labs & imaging results that were available during my care of the patient were reviewed by me and considered in my medical decision making (see chart for details).   This patient is Presenting for Evaluation of fever and abdominal pain, which does require a range of treatment options, and is a complaint that involves a high risk of morbidity and mortality.  The Differential Diagnoses includes but is not exclusive to ectopic pregnancy, ovarian cyst, ovarian torsion, acute appendicitis, urinary tract infection, endometriosis, bowel obstruction, hernia, colitis, renal colic, gastroenteritis, volvulus etc.   Critical Interventions- IVF and nausea medication with labs and additional imaging ordered.    Reassessment after intervention: Patient is comfortable appearing on reassessment.   I decided to review pertinent External Data, and in summary patient seen in the ED earlier today with w/u reviewed. No COVID test, urine culture, or abdominal imaging at that visit.    Clinical Laboratory Tests Ordered, included repeat labs, blood cultures, urine culture, and COVID swab.  On review of the lab results the patient does not have a leukocytosis or anemia.  Her lactic acid is normal.  She has no acute kidney injury.  Electrolytes are within normal limits.  No anion gap. COVID PCR is positive.   Radiologic Tests Ordered, included CT abdomen and pelvis along with chest x-ray.  I independently  evaluated the images.  Patient noted to have colitis on CT with differential, per radiology,  including inflammatory or infectious etiology.  Chest x-ray with no infiltrate, edema, pneumothorax.   Cardiac Monitor Tracing which shows NSR.  Reevaluation with update and discussion with patient I believe that most of her fever and infectious symptoms can likely be explained by her COVID-19 diagnosis.  I am sending her urine for culture but she had few symptoms of UTI initially and has been on Bactrim for several days which may be affecting her results here.  Her CT is showing colitis.  Patient not having severe diarrhea or bloody stools.  She has had GI issues and pain like this increasingly in the past several months but seems to be more of a subacute issue.  Given the location of inflammation I do plan to refer her to GI for consideration of inflammatory bowel disease.  We discussed that I will change her antibiotics to Augmentin in case this is more of an infectious type etiology but suspect that most of her symptoms are explained by COVID 19.    Disposition: Discharge  ____________________________________________  FINAL CLINICAL IMPRESSION(S) / ED DIAGNOSES  Final diagnoses:  COVID-19  Colitis  Nausea and vomiting, unspecified vomiting type     MEDICATIONS GIVEN DURING THIS VISIT:  Medications  sodium chloride 0.9 % bolus 1,000 mL (0 mLs Intravenous Stopped 07/11/21 0234)  ondansetron (ZOFRAN) injection 4 mg (4 mg Intravenous Given 07/11/21 0131)  iohexol (OMNIPAQUE) 300 MG/ML solution 100 mL (100 mLs Intravenous Contrast Given 07/11/21 0137)  acetaminophen (TYLENOL) tablet 1,000 mg (1,000 mg Oral Given 07/11/21 0253)     NEW OUTPATIENT MEDICATIONS STARTED DURING THIS VISIT:  New Prescriptions   AMOXICILLIN-CLAVULANATE (AUGMENTIN) 875-125 MG TABLET    Take 1 tablet by mouth every 12 (twelve) hours.   NIRMATRELVIR/RITONAVIR EUA (PAXLOVID) 20 X 150 MG & 10 X 100MG  TABS    Take 3 tablets by mouth 2 (two) times daily for 5 days. Patient GFR is 60. Take nirmatrelvir (150 mg) two  tablets twice daily for 5 days and ritonavir (100 mg) one tablet twice daily for 5 days.    Note:  This document was prepared using Dragon voice recognition software and may include unintentional dictation errors.  Nanda Quinton, MD, [facility] Emergency Medicine    Carizma Dunsworth, Wonda Olds, MD 07/11/21 [phone]

## 2021-07-11 NOTE — Discharge Instructions (Addendum)
You were seen in the emergency room today with continued fever with chills and right flank pain.  Your CT scan showed some inflammation in the colon.  I am switching your antibiotics from Bactrim to Augmentin but this could be from a cause that would not respond to antibiotics.  I have placed a referral in our system for you to see the GI doctor on-call.  Please call to schedule an appointment.  Sometimes, your insurance requires a referral to come from your primary care doctor and so you will need to keep them in the loop as well.   I suspect that most of your symptoms are related to your COVID-19 diagnosis today.  I am starting you on antiviral medication.  Please take as directed.  If you develop shortness of breath, chest pain, confusion, or sudden worsening symptoms you should return to the emergency department.   Per CDC guidelines, you will need to remain in quarantine for the next 5 days and if your symptoms have resolved and your fever is gone he can return to your activities with a mask for at least the next 5 days.

## 2021-07-12 LAB — URINE CULTURE: Culture: <10000 — AB

## 2021-07-16 LAB — CULTURE, BLOOD (ROUTINE X 2)
Culture: NO GROWTH
Culture: NO GROWTH
Specimen Description: ADEQUATE

## 2021-08-07 ENCOUNTER — Encounter: Payer: Self-pay | Admitting: Registered Nurse

## 2021-09-22 ENCOUNTER — Encounter: Payer: Self-pay | Admitting: Registered Nurse

## 2021-12-28 ENCOUNTER — Other Ambulatory Visit: Payer: Self-pay

## 2021-12-28 DIAGNOSIS — F411 Generalized anxiety disorder: Secondary | ICD-10-CM

## 2021-12-28 NOTE — Telephone Encounter (Signed)
Patient called needing a refill on her lexapro, I let her know that she was overdue for a visit with Rich. She did schedule an appt but is out of her medication and wanted to know if she could get a refill to get her to her appt. I let her know that I would send him a message and follow up with her via my chart.

## 2021-12-29 MED ORDER — ESCITALOPRAM OXALATE 10 MG PO TABS: ORAL_TABLET | ORAL | 0 refills | Status: DC

## 2022-01-18 ENCOUNTER — Encounter: Payer: Self-pay | Admitting: Registered Nurse

## 2022-01-18 ENCOUNTER — Ambulatory Visit (INDEPENDENT_AMBULATORY_CARE_PROVIDER_SITE_OTHER): Payer: BC Managed Care – PPO | Admitting: Registered Nurse

## 2022-01-18 ENCOUNTER — Other Ambulatory Visit: Payer: Self-pay

## 2022-01-18 VITALS — BP 118/64 | HR 81 | Temp 98.1°F | Resp 18 | Ht 69.0 in | Wt 155.1 lb

## 2022-01-18 DIAGNOSIS — Z13220 Encounter for screening for lipoid disorders: Secondary | ICD-10-CM | POA: Diagnosis not present

## 2022-01-18 DIAGNOSIS — G43109 Migraine with aura, not intractable, without status migrainosus: Secondary | ICD-10-CM | POA: Diagnosis not present

## 2022-01-18 DIAGNOSIS — F411 Generalized anxiety disorder: Secondary | ICD-10-CM | POA: Insufficient documentation

## 2022-01-18 DIAGNOSIS — R12 Heartburn: Secondary | ICD-10-CM | POA: Diagnosis not present

## 2022-01-18 MED ORDER — ONDANSETRON 4 MG PO TBDP: 4.0000 mg | ORAL_TABLET | Freq: Three times a day (TID) | ORAL | 11 refills | Status: DC | PRN

## 2022-01-18 MED ORDER — ESCITALOPRAM OXALATE 10 MG PO TABS: ORAL_TABLET | ORAL | 3 refills | Status: DC

## 2022-01-18 MED ORDER — RIZATRIPTAN BENZOATE 10 MG PO TABS: ORAL_TABLET | ORAL | 3 refills | Status: DC

## 2022-01-18 MED ORDER — OMEPRAZOLE 20 MG PO CPDR: DELAYED_RELEASE_CAPSULE | ORAL | 3 refills | Status: DC

## 2022-01-18 NOTE — Assessment & Plan Note (Signed)
Well controlled with prn zofran and omeprazole. Plan to continue.

## 2022-01-18 NOTE — Assessment & Plan Note (Signed)
Well controlled with lexapro 10mg  po qd. Plan to continue.

## 2022-01-18 NOTE — Assessment & Plan Note (Signed)
Well controlled with PRN maxalt. Plan to continue.

## 2022-01-18 NOTE — Progress Notes (Signed)
Established Patient Office Visit  Subjective:  Patient ID: Rhonda Green, female    DOB: [birth year]  Age: 51 y.o. MRN: [id number]  CC:  Chief Complaint  Patient presents with   Medication Refill    Patient states she is here for a medication refill.    HPI Perrin Eddleman presents for med refill  Anxiety Doing well on lexapro 10mg  po qd Good effect, no AE. Hopes to continue  GERD Occasional. No weight changes, melena, or other red flags Uses omeprazole and zofran prn.  No AE.  Migraines Occasional. Can use maxalt daily prn. Good effect, no AE. Hopes to continue.    Outpatient Medications Prior to Visit  Medication Sig Dispense Refill   escitalopram (LEXAPRO) 10 MG tablet TAKE 1 TABLET(10 MG) BY MOUTH DAILY 90 tablet 0   omeprazole (PRILOSEC) 20 MG capsule TAKE 1 CAPSULE(20 MG) BY MOUTH DAILY 90 capsule 3   ondansetron (ZOFRAN-ODT) 4 MG disintegrating tablet Take 1 tablet (4 mg total) by mouth every 8 (eight) hours as needed for nausea or vomiting. 30 tablet 0   rizatriptan (MAXALT) 10 MG tablet TAKE 1 TABLET BY MOUTH DAILY MAY REPEAT AFTER 2 HOURS. DO NOT REPEAT UNTIL NEXT DAY. 90 tablet 3   amoxicillin-clavulanate (AUGMENTIN) 875-125 MG tablet Take 1 tablet by mouth every 12 (twelve) hours. (Patient not taking: Reported on 01/18/2022) 14 tablet 0   gabapentin (NEURONTIN) 300 MG capsule 1 capsule in morning and bedtime, 1 capsules in the afternoon as needed for anxiety. (Patient not taking: Reported on 01/18/2022) 180 capsule 3   sulfamethoxazole-trimethoprim (BACTRIM DS) 800-160 MG tablet Take 1 tablet by mouth 2 (two) times daily. (Patient not taking: Reported on 01/18/2022) 14 tablet 0   No facility-administered medications prior to visit.    Review of Systems  Constitutional: Negative.   HENT: Negative.    Eyes: Negative.   Respiratory: Negative.    Cardiovascular: Negative.   Gastrointestinal: Negative.   Genitourinary: Negative.   Musculoskeletal: Negative.   Skin:  Negative.   Neurological: Negative.   Psychiatric/Behavioral: Negative.    All other systems reviewed and are negative.     Objective:     BP 118/64   Pulse 81   Temp 98.1 F (36.7 C) (Temporal)   Resp 18   Ht 5\' 9"  (1.753 m)   Wt 155 lb 1.6 oz (70.4 kg)   SpO2 97%   BMI 22.90 kg/m   Wt Readings from Last 3 Encounters:  01/18/22 155 lb 1.6 oz (70.4 kg)  07/10/21 140 lb (63.5 kg)  07/10/21 140 lb (63.5 kg)   Physical Exam Vitals and nursing note reviewed.  Constitutional:      General: She is not in acute distress.    Appearance: Normal appearance. She is normal weight. She is not ill-appearing, toxic-appearing or diaphoretic.  Cardiovascular:     Rate and Rhythm: Normal rate and regular rhythm.     Heart sounds: Normal heart sounds. No murmur heard.    No friction rub. No gallop.  Pulmonary:     Effort: Pulmonary effort is normal. No respiratory distress.     Breath sounds: Normal breath sounds. No stridor. No wheezing, rhonchi or rales.  Chest:     Chest wall: No tenderness.  Skin:    General: Skin is warm and dry.  Neurological:     General: No focal deficit present.     Mental Status: She is alert and oriented to person, place, and time. Mental status is at  baseline.  Psychiatric:        Mood and Affect: Mood normal.        Behavior: Behavior normal.        Thought Content: Thought content normal.        Judgment: Judgment normal.     No results found for any visits on 01/18/22.    The 10-year ASCVD risk score (Arnett DK, et al., 2019) is: 1%    Assessment & Plan:   Problem List Items Addressed This Visit       Cardiovascular and Mediastinum   Migraine with aura and without status migrainosus, not intractable    Well controlled with PRN maxalt. Plan to continue.      Relevant Medications   escitalopram (LEXAPRO) 10 MG tablet   rizatriptan (MAXALT) 10 MG tablet   Other Relevant Orders   CBC with Differential/Platelet   Comprehensive  metabolic panel   Hemoglobin A1c   Lipid panel   TSH     Other   GAD (generalized anxiety disorder) - Primary    Well controlled with lexapro 10mg  po qd. Plan to continue.       Relevant Medications   escitalopram (LEXAPRO) 10 MG tablet   Heartburn    Well controlled with prn zofran and omeprazole. Plan to continue.       Relevant Medications   omeprazole (PRILOSEC) 20 MG capsule   ondansetron (ZOFRAN-ODT) 4 MG disintegrating tablet   Other Relevant Orders   CBC with Differential/Platelet   Comprehensive metabolic panel   Hemoglobin A1c   Lipid panel   TSH   Other Visit Diagnoses     Lipid screening       Relevant Orders   CBC with Differential/Platelet   Comprehensive metabolic panel   Hemoglobin A1c   Lipid panel   TSH       Meds ordered this encounter  Medications   escitalopram (LEXAPRO) 10 MG tablet    Sig: TAKE 1 TABLET(10 MG) BY MOUTH DAILY    Dispense:  90 tablet    Refill:  3    Order Specific Question:   Supervising Provider    Answer:   , JEFFREY R [[id number]]   omeprazole (PRILOSEC) 20 MG capsule    Sig: TAKE 1 CAPSULE(20 MG) BY MOUTH DAILY    Dispense:  90 capsule    Refill:  3    Order Specific Question:   Supervising Provider    Answer:   Neva Seat, JEFFREY R [[id number]]   ondansetron (ZOFRAN-ODT) 4 MG disintegrating tablet    Sig: Take 1 tablet (4 mg total) by mouth every 8 (eight) hours as needed for nausea or vomiting.    Dispense:  30 tablet    Refill:  11    Order Specific Question:   Supervising Provider    Answer:   Neva Seat, JEFFREY R [[id number]]   rizatriptan (MAXALT) 10 MG tablet    Sig: TAKE 1 TABLET BY MOUTH DAILY MAY REPEAT AFTER 2 HOURS. DO NOT REPEAT UNTIL NEXT DAY.    Dispense:  90 tablet    Refill:  3    Order Specific Question:   Supervising Provider    Answer:   Neva Seat, JEFFREY R [[id number]]    Return in about 1 year (around 01/19/2023) for CPE and labs.   PLAN See problem based charting.  Routine labs collected Patient encouraged  to call clinic with any questions, comments, or concerns.   01/21/2023, NP

## 2022-01-18 NOTE — Patient Instructions (Addendum)
Rhonda Green -   Thank you for letting me take part in your care.  I recommend these providers going forward:  Jarold Motto, [place] Jacquiline Doe, MD Glenetta Hew, MD Letta Moynahan Early, NP Jiles Prows, DNP  Please let me know if you need anything in the next few weeks!  Thank you again,  Rich   If you have lab work done today you will be contacted with your lab results within the next 2 weeks.  If you have not heard from [place] then please contact us. The fastest way to get your results is to register for My Chart.   IF you received an x-ray today, you will receive an invoice from [facility]. Please contact [facility] at [phone] with questions or concerns regarding your invoice.   IF you received labwork today, you will receive an invoice from [facility]. Please contact [facility] at [phone] with questions or concerns regarding your invoice.   Our billing staff will not be able to assist you with questions regarding bills from these companies.  You will be contacted with the lab results as soon as they are available. The fastest way to get your results is to activate your My Chart account. Instructions are located on the last page of this paperwork. If you have not heard from [place] regarding the results in 2 weeks, please contact this office.

## 2022-01-19 ENCOUNTER — Encounter: Payer: Self-pay | Admitting: Registered Nurse

## 2022-01-19 LAB — COMPREHENSIVE METABOLIC PANEL
ALT: 10 U/L (ref 0–35)
AST: 18 U/L (ref 0–37)
Albumin: 4.1 g/dL (ref 3.5–5.2)
Alkaline Phosphatase: 71 U/L (ref 39–117)
BUN: 10 mg/dL (ref 6–23)
CO2: 26 mEq/L (ref 19–32)
Calcium: 9.2 mg/dL (ref 8.4–10.5)
Chloride: 105 mEq/L (ref 96–112)
Creatinine, Ser: 0.99 mg/dL (ref 0.40–1.20)
GFR: 66.98 mL/min (ref >60.00)
Glucose, Bld: 86 mg/dL (ref 70–99)
Potassium: 3.8 mEq/L (ref 3.5–5.1)
Sodium: 137 mEq/L (ref 135–145)
Total Bilirubin: 0.3 mg/dL (ref 0.2–1.2)
Total Protein: 6.6 g/dL (ref 6.0–8.3)

## 2022-01-19 LAB — LIPID PANEL
Cholesterol: 203 mg/dL — ABNORMAL HIGH (ref 0–200)
HDL: 43.3 mg/dL (ref >39.00)
LDL Cholesterol: 132 mg/dL — ABNORMAL HIGH (ref 0–99)
NonHDL: 159.69
Total CHOL/HDL Ratio: 5
Triglycerides: 138 mg/dL (ref 0.0–149.0)
VLDL: 27.6 mg/dL (ref 0.0–40.0)

## 2022-01-19 LAB — CBC WITH DIFFERENTIAL/PLATELET
Basophils Absolute: 0.1 10*3/uL (ref 0.0–0.1)
Basophils Relative: 1 % (ref 0.0–3.0)
Eosinophils Absolute: 0.2 10*3/uL (ref 0.0–0.7)
Eosinophils Relative: 2.4 % (ref 0.0–5.0)
HCT: 38.8 % (ref 36.0–46.0)
Hemoglobin: 13.2 g/dL (ref 12.0–15.0)
Lymphocytes Relative: 24.9 % (ref 12.0–46.0)
Lymphs Abs: 1.9 10*3/uL (ref 0.7–4.0)
MCHC: 34.1 g/dL (ref 30.0–36.0)
MCV: 91.6 fl (ref 78.0–100.0)
Monocytes Absolute: 0.5 10*3/uL (ref 0.1–1.0)
Monocytes Relative: 7 % (ref 3.0–12.0)
Neutro Abs: 5 10*3/uL (ref 1.4–7.7)
Neutrophils Relative %: 64.7 % (ref 43.0–77.0)
Platelets: 313 10*3/uL (ref 150.0–400.0)
RBC: 4.24 Mil/uL (ref 3.87–5.11)
RDW: 13.4 % (ref 11.5–15.5)
WBC: 7.8 10*3/uL (ref 4.0–10.5)

## 2022-01-19 LAB — HEMOGLOBIN A1C: Hgb A1c MFr Bld: 5.7 % (ref 4.6–6.5)

## 2022-01-19 LAB — TSH: TSH: 2.26 u[IU]/mL (ref 0.35–5.50)

## 2022-01-20 ENCOUNTER — Other Ambulatory Visit: Payer: Self-pay | Admitting: Registered Nurse

## 2022-01-20 DIAGNOSIS — L905 Scar conditions and fibrosis of skin: Secondary | ICD-10-CM

## 2022-01-20 MED ORDER — TRETINOIN 0.025 % EX CREA: TOPICAL_CREAM | Freq: Every day | CUTANEOUS | 1 refills | Status: AC

## 2022-01-20 NOTE — Progress Notes (Signed)
Has used retin-a in the past for scarring with good effect. Not pregnant, does not intend to become pregnant  Jari Sportsman, NP

## 2022-10-04 ENCOUNTER — Encounter: Payer: Self-pay | Admitting: Nurse Practitioner

## 2022-10-04 ENCOUNTER — Ambulatory Visit: Payer: BC Managed Care – PPO | Admitting: Nurse Practitioner

## 2022-10-04 VITALS — BP 124/82 | HR 75 | Ht 69.25 in | Wt 154.4 lb

## 2022-10-04 DIAGNOSIS — F411 Generalized anxiety disorder: Secondary | ICD-10-CM | POA: Diagnosis not present

## 2022-10-04 DIAGNOSIS — K582 Mixed irritable bowel syndrome: Secondary | ICD-10-CM | POA: Diagnosis not present

## 2022-10-04 DIAGNOSIS — R0789 Other chest pain: Secondary | ICD-10-CM | POA: Insufficient documentation

## 2022-10-04 DIAGNOSIS — F5104 Psychophysiologic insomnia: Secondary | ICD-10-CM

## 2022-10-04 MED ORDER — ESCITALOPRAM OXALATE 10 MG PO TABS: 15.0000 mg | ORAL_TABLET | Freq: Every day | ORAL | 3 refills | Status: DC

## 2022-10-04 MED ORDER — DICYCLOMINE HCL 10 MG PO CAPS: 10.0000 mg | ORAL_CAPSULE | Freq: Three times a day (TID) | ORAL | 3 refills | Status: DC

## 2022-10-04 MED ORDER — RIFAXIMIN 550 MG PO TABS: 550.0000 mg | ORAL_TABLET | Freq: Three times a day (TID) | ORAL | 0 refills | Status: DC

## 2022-10-04 MED ORDER — HYDROXYZINE HCL 25 MG PO TABS: 25.0000 mg | ORAL_TABLET | Freq: Every evening | ORAL | 3 refills | Status: DC | PRN

## 2022-10-04 NOTE — Assessment & Plan Note (Signed)
Rhonda Green has episodes of severe stomach cramps, nausea, sweating, and diarrhea sometimes accompanied by vomiting. These do seem to correlate with periods of emotional distress and increased anxiety. Recently she has been waking nearly every morning with these symptoms. Previous imaging reviewed today that showed the presence of diverticula and inflammation suggesting infectious colitis. Discussion with her today and review of colonoscopy and imagine provides reassurance that there is no evidence of inflammatory bowel disease. Given the symptoms and presentation, I suspect that IBS is the primary cause of her intestinal distress. We discussed treatment options that may be helpful. Given the level of severity, I recommend a trial of Rifaximin 550mg  three times a day for 14 days and Bentyl as needed for cramping. Plan: - Rifaximin 550mg  three times a day for 2 weeks. 3 days of samples provided.  - Bentyl up to 4 times a day for abdominal cramping.  - Increase lexapro to help with anxiety - Work on monitoring foods that may trigger GI upset

## 2022-10-04 NOTE — Assessment & Plan Note (Signed)
Rhonda Green reports new episodes of intermittent stabbing chest pain that occur unexpectedly and last only seconds over the last several months. She does also endorse intermittent sensation of palpitations. These episodes have seemed to resolve over the last three weeks. The pain is described as sharp, brief, and on the left side of the sternum. She has no alarm symptoms associated. We discussed various etiologies of this, but given the lack of associated symptoms I strongly suspect that this is not cardiac in nature. Joint decision made to monitor closely for the symptoms and if they restart, we can consider a cardiology referral or xio patch for monitoring.  Plan: - monitor for new or recurrent symptoms and notify

## 2022-10-04 NOTE — Assessment & Plan Note (Signed)
Rhonda Green is currently taking Lexapro for anxiety, which is providing some relief, but reports increased stress and difficulty sleeping. She does have quite a bit of stressors in her life at this time. We discussed the option to increase the Lexapro to 15mg  a day and she would like to try this. We also discussed options that may help with sleep.  Plan: - Increase Lexapro dosage to 15mg  daily to see if this provides additional relief for anxiety and improves sleep quality. - Prescribe Hydroxyzine to be taken as needed for anxiety and sleep disturbances, which can be used safely every night or on an as-needed basis and is not habit-forming. - Follow-up in about 6 weeks to see how medication change is working.

## 2022-10-04 NOTE — Progress Notes (Signed)
Tollie Eth, DNP, AGNP-c [facility] [address] [place], [place] [address] Main Office [phone]   New patient visit   Patient: Rhonda Green   DOB: 05-15-1971   52 y.o. Female  MRN: [id number] Visit Date: 10/04/2022  Patient Care Team: Tollie Eth, NP as PCP - General (Nurse Practitioner)  Today's Vitals   10/04/22 1504  BP: 124/82  Pulse: 75  Weight: 154 lb 6.4 oz (70 kg)  Height: 5' 9.25" (1.759 m)   Body mass index is 22.64 kg/m.   Today's healthcare provider: Tollie Eth, NP   Chief Complaint  Patient presents with   other    New pt. Est. Has been having chest pains for 3 months off and on, pancreas issues at ER, had imaging done   Subjective    Zayley Brockie is a 52 y.o. female who presents today as a new patient to establish care.    Rhonda Green presents today with chief complaints of experiencing episodes of vomiting lasting about a week, occurring a few times a year for the last few years. During one of these episodes, imaging suggested her pancreas was inflamed, but she has been unable to follow up due to being in crisis mode with her son's suicidal tendencies. She wonders if this issue needs to be re-examined.  Rhonda Green reports a recent emergency room visit where it was initially thought her pancreas was the issue, but further review of her medical records from January 2023 revealed findings suggestive of colitis involving the ascending and transverse colon, possibly due to medication for a UTI, and diverticulosis. She was informed that the symptoms she experiences, including bad stomach pain, nausea, diarrhea, vomiting, and fevers, could be related to diverticulitis or inflammatory/infectious colitis. We also discussed the possibility of IBS as a contributor.   She reports waking up often with cold sweats and a feeling of panic, followed by immediate diarrhea, which makes her feel unable to go to work. This occurs mostly in the mornings and seems  to be related to her emotional state, suggesting a possible link to irritable bowel syndrome. She reports that once she has a bowel movement the symptoms subside and she is able to carry out her day.   Rhonda Green has a history of scattered small diverticula found during a colonoscopy, with normal mucosa throughout the colon, indicating she does not have ulcerative colitis or Crohn's disease. The CT taken at the emergency room visit and previous colonoscopy show no evidence of chronic inflammatory bowel disease, which is reassuring.   She also mentions experiencing new stabbing chest pains a few times a week for a few months. She reports the onset is sudden and she cannot think of a trigger. She also reports the event is short lived without any associated symptoms including nausea, sweating, jaw/shoulder/arm pain. The episodes subside in a matter of seconds. She reports that she has not had any of these events in the last 3 weeks.  She is currently taking Omeprazole for gerd and reports good control.  She is taking Lexapro for her mood, which she feels is working pretty well, however, not as well as she would like due to her high levels of stress and anxiety. She is considering increasing the dosage of Lexapro. She does have difficulty sleeping at night, which is also bothersome for her.   Currently she is dealing with serious mental health issues involving her son which have caused multiple hospitalizations and incarcerations without true resolution. She  reports her sons mental status has caused a significant decline into alcoholism and now homelessness. At this time her daughter and grandchildren are living with her, therefore she has had to turn her son away several times from the home due to concerns for the safety of the children. She tells me that she feels he would be safest if he was institutionalized due to his worsening mental health status and behaviors.   History reviewed and reveals the  following: Past Medical History:  Diagnosis Date   Anxiety    Depression    GERD (gastroesophageal reflux disease)    Past Surgical History:  Procedure Laterality Date   ABDOMINAL HYSTERECTOMY     CESAREAN SECTION     CHOLECYSTECTOMY     Family Status  Relation Name Status   Mother  Alive       diagnosed at age 53   Father  Deceased   Family History  Problem Relation Age of Onset   Diabetes Mother    Hypertension Mother    Breast cancer Mother 87   Anuerysm Father 43   Social History   Socioeconomic History   Marital status: Legally Separated    Spouse name: Not on file   Number of children: 2   Years of education: Not on file   Highest education level: Not on file  Occupational History   Not on file  Tobacco Use   Smoking status: Never   Smokeless tobacco: Never  Vaping Use   Vaping Use: Never used  Substance and Sexual Activity   Alcohol use: No    Alcohol/week: 0.0 standard drinks of alcohol   Drug use: No   Sexual activity: Yes    Birth control/protection: Surgical  Other Topics Concern   Not on file  Social History Narrative   Not on file   Social Determinants of Health   Financial Resource Strain: Not on file  Food Insecurity: Not on file  Transportation Needs: Not on file  Physical Activity: Not on file  Stress: Not on file  Social Connections: Not on file   Outpatient Medications Prior to Visit  Medication Sig   omeprazole (PRILOSEC) 20 MG capsule TAKE 1 CAPSULE(20 MG) BY MOUTH DAILY   ondansetron (ZOFRAN-ODT) 4 MG disintegrating tablet Take 1 tablet (4 mg total) by mouth every 8 (eight) hours as needed for nausea or vomiting.   rizatriptan (MAXALT) 10 MG tablet TAKE 1 TABLET BY MOUTH DAILY MAY REPEAT AFTER 2 HOURS. DO NOT REPEAT UNTIL NEXT DAY.   tretinoin (RETIN-A) 0.025 % cream Apply topically at bedtime.   [DISCONTINUED] escitalopram (LEXAPRO) 10 MG tablet TAKE 1 TABLET(10 MG) BY MOUTH DAILY   No facility-administered medications prior  to visit.   Allergies  Allergen Reactions   Morphine And Related Nausea And Vomiting   There is no immunization history for the selected administration types on file for this patient.  Health Maintenance Due Health Maintenance Topics with due status: Overdue     Topic Date Due   COVID-19 Vaccine Never done   Hepatitis C Screening Never done   DTaP/Tdap/Td Never done   PAP SMEAR-Modifier Never done   MAMMOGRAM 08/07/2022   Zoster Vaccines- Shingrix Never done    Review of Systems All review of systems negative except what is listed in the HPI   Objective    BP 124/82   Pulse 75   Ht 5' 9.25" (1.759 m)   Wt 154 lb 6.4 oz (70 kg)   BMI 22.64  kg/m  Physical Exam Vitals and nursing note reviewed.  Constitutional:      Appearance: Normal appearance.  HENT:     Head: Normocephalic.  Eyes:     Pupils: Pupils are equal, round, and reactive to light.  Neck:     Vascular: No carotid bruit.  Cardiovascular:     Rate and Rhythm: Normal rate and regular rhythm.     Pulses: Normal pulses.     Heart sounds: Normal heart sounds.  Pulmonary:     Effort: Pulmonary effort is normal.     Breath sounds: Normal breath sounds.  Chest:     Chest wall: No tenderness.  Abdominal:     General: Bowel sounds are normal.     Palpations: Abdomen is soft.  Musculoskeletal:     Cervical back: Normal range of motion.     Right lower leg: No edema.     Left lower leg: Edema present.     Comments: Chronic lymphedema in left lower extremity. Skin intact.   Skin:    General: Skin is warm and dry.     Capillary Refill: Capillary refill takes less than 2 seconds.  Neurological:     General: No focal deficit present.     Mental Status: She is alert and oriented to person, place, and time.  Psychiatric:        Attention and Perception: Attention normal.        Mood and Affect: Mood normal. Affect is tearful.        Speech: Speech normal.        Behavior: Behavior normal. Behavior is  cooperative.        Thought Content: Thought content normal.        Cognition and Memory: Cognition and memory normal.        Judgment: Judgment normal.     No results found for any visits on 10/04/22.  Assessment & Plan      Problem List Items Addressed This Visit     GAD (generalized anxiety disorder)    Rhonda Green is currently taking Lexapro for anxiety, which is providing some relief, but reports increased stress and difficulty sleeping. She does have quite a bit of stressors in her life at this time. We discussed the option to increase the Lexapro to 15mg  a day and she would like to try this. We also discussed options that may help with sleep.  Plan: - Increase Lexapro dosage to 15mg  daily to see if this provides additional relief for anxiety and improves sleep quality. - Prescribe Hydroxyzine to be taken as needed for anxiety and sleep disturbances, which can be used safely every night or on an as-needed basis and is not habit-forming. - Follow-up in about 6 weeks to see how medication change is working.       Relevant Medications   hydrOXYzine (ATARAX) 25 MG tablet   escitalopram (LEXAPRO) 10 MG tablet   Irritable bowel syndrome with both constipation and diarrhea - Primary    Rhonda Green has episodes of severe stomach cramps, nausea, sweating, and diarrhea sometimes accompanied by vomiting. These do seem to correlate with periods of emotional distress and increased anxiety. Recently she has been waking nearly every morning with these symptoms. Previous imaging reviewed today that showed the presence of diverticula and inflammation suggesting infectious colitis. Discussion with her today and review of colonoscopy and imagine provides reassurance that there is no evidence of inflammatory bowel disease. Given the symptoms and presentation, I suspect that IBS is  the primary cause of her intestinal distress. We discussed treatment options that may be helpful. Given the level of severity, I recommend a  trial of Rifaximin 550mg  three times a day for 14 days and Bentyl as needed for cramping. Plan: - Rifaximin 550mg  three times a day for 2 weeks. 3 days of samples provided.  - Bentyl up to 4 times a day for abdominal cramping.  - Increase lexapro to help with anxiety - Work on monitoring foods that may trigger GI upset      Relevant Medications   rifaximin (XIFAXAN) 550 MG TABS tablet   dicyclomine (BENTYL) 10 MG capsule   Psychophysiological insomnia   Relevant Medications   hydrOXYzine (ATARAX) 25 MG tablet   escitalopram (LEXAPRO) 10 MG tablet   Atypical chest pain    Rhonda Green reports new episodes of intermittent stabbing chest pain that occur unexpectedly and last only seconds over the last several months. She does also endorse intermittent sensation of palpitations. These episodes have seemed to resolve over the last three weeks. The pain is described as sharp, brief, and on the left side of the sternum. She has no alarm symptoms associated. We discussed various etiologies of this, but given the lack of associated symptoms I strongly suspect that this is not cardiac in nature. Joint decision made to monitor closely for the symptoms and if they restart, we can consider a cardiology referral or xio patch for monitoring.  Plan: - monitor for new or recurrent symptoms and notify        Return in about 6 weeks (around 11/15/2022) for med check- virtual is ok.      Cerrone Debold, Sung AmabileSara E, NP, DNP, AGNP-C [facility]

## 2022-10-04 NOTE — Patient Instructions (Signed)
If you have any questions or concerns, please do not hesitate to reach out. I would like to see how you are feeling in about 6 weeks just to make sure the medications have worked well for you and make sure we don't need to make any changes.

## 2022-11-05 ENCOUNTER — Encounter: Payer: Self-pay | Admitting: Nurse Practitioner

## 2022-11-05 DIAGNOSIS — G43109 Migraine with aura, not intractable, without status migrainosus: Secondary | ICD-10-CM

## 2022-11-05 MED ORDER — RIZATRIPTAN BENZOATE 10 MG PO TABS: ORAL_TABLET | ORAL | 3 refills | Status: DC

## 2022-11-15 ENCOUNTER — Encounter: Payer: Self-pay | Admitting: Nurse Practitioner

## 2022-11-15 ENCOUNTER — Telehealth: Payer: BC Managed Care – PPO | Admitting: Nurse Practitioner

## 2022-11-15 VITALS — Wt 150.0 lb

## 2022-11-15 DIAGNOSIS — F411 Generalized anxiety disorder: Secondary | ICD-10-CM

## 2022-11-15 DIAGNOSIS — K582 Mixed irritable bowel syndrome: Secondary | ICD-10-CM

## 2022-11-15 NOTE — Assessment & Plan Note (Signed)
Significant improvement of anxiety symptoms with increased Lexapro and improvement in her home situation dealing with her son's mental health concerns.  Plan: - continue Lexapro 15mg  daily - Let me know if your mood begins to change or worsen.

## 2022-11-15 NOTE — Progress Notes (Signed)
Virtual Visit Encounter mychart visit.   I connected with  Jetty Peeks on 11/15/22 at  4:30 PM EDT by secure video and audio telemedicine application. I verified that I am speaking with the correct person using two identifiers.   I introduced myself as a Publishing rights manager with the practice. The limitations of evaluation and management by telemedicine discussed with the patient and the availability of in person appointments. The patient expressed verbal understanding and consent to proceed.  Participating parties in this visit include: Myself and patient  The patient is: Patient Location: Home I am: Provider Location: Office/Clinic Subjective:    CC and HPI: Dorenda Schwarzkopf is a 52 y.o. year old female presenting for follow up of mood and IBS. Patient reports the following:  At her last visit we increased Ardelia's Lexapro dose to 1.5 tablets (15 mg) due to increased depression and anxiety symptoms. Her symptoms were primarily stemming from concerns with her son's mental health and behaviors and she was struggling to cope with these issues. Today she tells me she is feeling much better with no concerns. She feels the medication increase has helped, but also attributes her mood improvement with her son's improvement and successful changes with life choices. She would like to stay on the current increased dose of Lexapro at this time.   She did not start the medication for IBS as she was concerned about side effects being worse than symptoms. She is monitoring her symptoms and keeping a journal to identify triggers.   Past medical history, Surgical history, Family history not pertinant except as noted below, Social history, Allergies, and medications have been entered into the medical record, reviewed, and corrections made.   Review of Systems:  All review of systems negative except what is listed in the HPI  Objective:    Alert and oriented x 4 Speaking in clear sentences with no shortness of  breath. No distress.  Impression and Recommendations:    Problem List Items Addressed This Visit     GAD (generalized anxiety disorder) - Primary    Significant improvement of anxiety symptoms with increased Lexapro and improvement in her home situation dealing with her son's mental health concerns.  Plan: - continue Lexapro 15mg  daily - Let me know if your mood begins to change or worsen.       Irritable bowel syndrome with both constipation and diarrhea    Chronic IBS. She is currently monitoring her symptoms to recognize triggers. Recommend continue to monitor for triggers and work to avoid these to prevent worsening symptoms. F/U as needed.        current treatment plan is effective, no change in therapy I discussed the assessment and treatment plan with the patient. The patient was provided an opportunity to ask questions and all were answered. The patient agreed with the plan and demonstrated an understanding of the instructions.   The patient was advised to call back or seek an in-person evaluation if the symptoms worsen or if the condition fails to improve as anticipated.  Follow-Up: prn  I provided 7 minutes of non-face-to-face interaction with this non face-to-face encounter including intake, same-day documentation, and chart review.   Tollie Eth, NP , DNP, AGNP-c [facility]

## 2022-11-15 NOTE — Assessment & Plan Note (Signed)
Chronic IBS. She is currently monitoring her symptoms to recognize triggers. Recommend continue to monitor for triggers and work to avoid these to prevent worsening symptoms. F/U as needed.

## 2023-08-22 ENCOUNTER — Ambulatory Visit: Payer: 59 | Admitting: Sports Medicine

## 2023-08-22 ENCOUNTER — Ambulatory Visit (INDEPENDENT_AMBULATORY_CARE_PROVIDER_SITE_OTHER): Payer: 59

## 2023-08-22 VITALS — BP 124/78 | HR 78 | Ht 69.0 in | Wt 149.0 lb

## 2023-08-22 DIAGNOSIS — M533 Sacrococcygeal disorders, not elsewhere classified: Secondary | ICD-10-CM

## 2023-08-22 DIAGNOSIS — M545 Low back pain, unspecified: Secondary | ICD-10-CM

## 2023-08-22 DIAGNOSIS — G8929 Other chronic pain: Secondary | ICD-10-CM

## 2023-08-22 MED ORDER — MELOXICAM 15 MG PO TABS: 15.0000 mg | ORAL_TABLET | Freq: Every day | ORAL | 0 refills | Status: DC

## 2023-08-22 NOTE — Progress Notes (Signed)
 Rhonda Green [facility] [address] [place] [address] Phone: [phone]   Assessment and Plan:     1. Chronic bilateral low back pain without sciatica 2. Chronic left SI joint pain 3. Chronic right SI joint pain -Chronic with exacerbation, initial sports medicine visit - Pain primarily in bilateral lower lumbar spine and bilateral SI joints flared by recent viral illness 3 weeks ago - X-ray obtained in clinic.  My interpretation: No acute fracture or vertebral collapse.  Mild facet arthropathy.  Mild anterior vertebral spurring at L2-L3 - Start HEP for low back.  Offered physical therapy which was declined at today's visit, though could consider referral at future visit if no improvement - Start meloxicam 15 mg daily x2 weeks.  If still having pain after 2 weeks, complete 3rd-week of NSAID. May use remaining NSAID as needed once daily for pain control.  Do not to use additional over-the-counter NSAIDs (ibuprofen, naproxen, Advil, Aleve) while taking prescription NSAIDs.  May use Tylenol [phone] mg 2 to 3 times a day for breakthrough pain.    Pertinent previous records reviewed include none  Follow Up: Weeks for reevaluation.  If no improvement or worsening of symptoms, could discuss OMT versus physical therapy versus advanced imaging versus prednisone course   Subjective:   I, Rhonda Green, am serving as a Neurosurgeon for Doctor Richardean Sale  Chief Complaint: low back pain   HPI:   08/22/23 [name] with low back pain. Patient states pain for years. She isnt able to move around. Sometimes pain comes on from a cough. She has been told she has bad discs and bone spurs. No meds for the pain. Pain is constant. Pain radiates down the legs. No numbness or tingling. Pain when walking, laying in bed. Hx of 4-5 falls on the ice over her lifetime    Relevant Historical Information: None pertinent  Additional pertinent  review of systems negative.   Current Outpatient Medications:    escitalopram (LEXAPRO) 10 MG tablet, Take 1.5 tablets (15 mg total) by mouth daily., Disp: 135 tablet, Rfl: 3   meloxicam (MOBIC) 15 MG tablet, Take 1 tablet (15 mg total) by mouth daily., Disp: 30 tablet, Rfl: 0   omeprazole (PRILOSEC) 20 MG capsule, TAKE 1 CAPSULE(20 MG) BY MOUTH DAILY, Disp: 90 capsule, Rfl: 3   ondansetron (ZOFRAN-ODT) 4 MG disintegrating tablet, Take 1 tablet (4 mg total) by mouth every 8 (eight) hours as needed for nausea or vomiting., Disp: 30 tablet, Rfl: 11   rizatriptan (MAXALT) 10 MG tablet, Take 1 tablet by mouth at the first sign of migraine. May repeat one time after 2 hours if no improvement. Do not take more than 2 times in 24 hours., Disp: 54 tablet, Rfl: 3   tretinoin (RETIN-A) 0.025 % cream, Apply topically at bedtime., Disp: 45 g, Rfl: 1   Objective:     Vitals:   08/22/23 1527  BP: 124/78  Pulse: 78  SpO2: 99%  Weight: 149 lb (67.6 kg)  Height: 5\' 9"  (1.753 m)      Body mass index is 22 kg/m.    Physical Exam:    Gen: Appears well, nad, nontoxic and pleasant Psych: Alert and oriented, appropriate mood and affect Neuro: sensation intact, strength is 5/5 in upper and lower extremities, muscle tone wnl Skin: no susupicious lesions or rashes  Back - Normal skin, Spine with normal alignment and no deformity.   No tenderness  to vertebral process palpation.   Bilateral lumbar paraspinous muscles are   tender and without spasm NTTP gluteal musculature Straight leg raise negative Trendelenberg positive left Piriformis Test negative Gait normal  Nonradiating low back pain with lumbar extension and flexion Tightness through hamstrings with knee extension bilaterally  Electronically signed by:  Rhonda Green [facility] 3:56 PM 08/22/23

## 2023-08-22 NOTE — Patient Instructions (Signed)
-   Start meloxicam 15 mg daily x2 weeks.  If still having pain after 2 weeks, complete 3rd-week of NSAID. May use remaining NSAID as needed once daily for pain control.  Do not to use additional over-the-counter NSAIDs (ibuprofen, naproxen, Advil, Aleve) while taking prescription NSAIDs.  May use Tylenol [phone] mg 2 to 3 times a day for breakthrough pain. Low back HEP  4 week follow up

## 2023-09-07 ENCOUNTER — Encounter: Payer: Self-pay | Admitting: Sports Medicine

## 2023-09-16 NOTE — Progress Notes (Signed)
    Rhonda Green [facility] [address] [place] [address] Phone: [phone]   Assessment and Plan:     1. Chronic bilateral low back pain without sciatica 2. Chronic left SI joint pain 3. Chronic right SI joint pain  -Chronic with exacerbation, subsequent visit - Overall significant relief after completing 3 to 4-week course of meloxicam, starting HEP.  Consistent with resolving muscular flare likely exacerbated by viral illness - Continue HEP - Start Tylenol 500 to 1000 mg tablets 2-3 times a day for day-to-day pain relief - Discontinue meloxicam 15 mg daily.  May use meloxicam 15 mg daily as needed for breakthrough pain.  Recommend limiting chronic NSAIDs to 1-2 doses per week.  Refill provided  Pertinent previous records reviewed include none  Follow Up: As needed if no improvement or worsening of symptoms.  Could discuss OMT versus physical therapy versus repeat NSAID course   Subjective:   I, Rhonda Green, am serving as a Neurosurgeon for Doctor Richardean Sale   Chief Complaint: low back pain    HPI:    08/22/23 [name] with low back pain. Patient states pain for years. She isnt able to move around. Sometimes pain comes on from a cough. She has been told she has bad discs and bone spurs. No meds for the pain. Pain is constant. Pain radiates down the legs. No numbness or tingling. Pain when walking, laying in bed. Hx of 4-5 falls on the ice over her lifetime    09/19/2023 Patient states she is doing so much better    Relevant Historical Information: None pertinent  Additional pertinent review of systems negative.   Current Outpatient Medications:    escitalopram (LEXAPRO) 10 MG tablet, Take 1.5 tablets (15 mg total) by mouth daily., Disp: 135 tablet, Rfl: 3   meloxicam (MOBIC) 15 MG tablet, Take 1 tablet (15 mg total) by mouth daily., Disp: 30 tablet, Rfl: 0   omeprazole (PRILOSEC) 20 MG capsule, TAKE 1  CAPSULE(20 MG) BY MOUTH DAILY, Disp: 90 capsule, Rfl: 3   ondansetron (ZOFRAN-ODT) 4 MG disintegrating tablet, Take 1 tablet (4 mg total) by mouth every 8 (eight) hours as needed for nausea or vomiting., Disp: 30 tablet, Rfl: 11   rizatriptan (MAXALT) 10 MG tablet, Take 1 tablet by mouth at the first sign of migraine. May repeat one time after 2 hours if no improvement. Do not take more than 2 times in 24 hours., Disp: 54 tablet, Rfl: 3   tretinoin (RETIN-A) 0.025 % cream, Apply topically at bedtime., Disp: 45 g, Rfl: 1   Objective:     Vitals:   09/19/23 1527  Pulse: 90  SpO2: 99%  Weight: 149 lb (67.6 kg)  Height: 5\' 9"  (1.753 m)      Body mass index is 22 kg/m.    Physical Exam:    Gen: Appears well, nad, nontoxic and pleasant Psych: Alert and oriented, appropriate mood and affect Neuro: sensation intact, strength is 5/5 in upper and lower extremities, muscle tone wnl Skin: no susupicious lesions or rashes  Back - Normal skin, Spine with normal alignment and no deformity.   No tenderness to vertebral process palpation.   Paraspinous muscles are not tender and without spasm NTTP gluteal musculature Straight leg raise negative Trendelenberg negative Piriformis Test negative Gait normal    Electronically signed by:  Rhonda Green [facility] 3:35 PM 09/19/23

## 2023-09-19 ENCOUNTER — Ambulatory Visit: Payer: 59 | Admitting: Sports Medicine

## 2023-09-19 VITALS — HR 90 | Ht 69.0 in | Wt 149.0 lb

## 2023-09-19 DIAGNOSIS — M533 Sacrococcygeal disorders, not elsewhere classified: Secondary | ICD-10-CM | POA: Diagnosis not present

## 2023-09-19 DIAGNOSIS — G8929 Other chronic pain: Secondary | ICD-10-CM

## 2023-09-19 DIAGNOSIS — M545 Low back pain, unspecified: Secondary | ICD-10-CM

## 2023-09-19 MED ORDER — MELOXICAM 15 MG PO TABS: 15.0000 mg | ORAL_TABLET | Freq: Every day | ORAL | 0 refills | Status: DC | PRN

## 2023-09-19 NOTE — Patient Instructions (Signed)
 Continue HEP  Tylenol [phone] mg 2-3 times a day for pain relief  Discontinue use daily meloxicam use remainder as needed no more than 1-2 times per week  Meloxicam refill  As needed follow up

## 2023-10-11 ENCOUNTER — Other Ambulatory Visit: Payer: Self-pay | Admitting: Nurse Practitioner

## 2023-10-11 DIAGNOSIS — F411 Generalized anxiety disorder: Secondary | ICD-10-CM

## 2023-10-11 DIAGNOSIS — F5104 Psychophysiologic insomnia: Secondary | ICD-10-CM

## 2023-10-12 ENCOUNTER — Other Ambulatory Visit: Payer: Self-pay | Admitting: Nurse Practitioner

## 2023-10-12 DIAGNOSIS — F411 Generalized anxiety disorder: Secondary | ICD-10-CM

## 2023-10-12 DIAGNOSIS — F5104 Psychophysiologic insomnia: Secondary | ICD-10-CM

## 2023-11-10 ENCOUNTER — Other Ambulatory Visit: Payer: Self-pay | Admitting: Nurse Practitioner

## 2023-11-10 DIAGNOSIS — F5104 Psychophysiologic insomnia: Secondary | ICD-10-CM

## 2023-11-10 DIAGNOSIS — F411 Generalized anxiety disorder: Secondary | ICD-10-CM

## 2023-11-10 NOTE — Telephone Encounter (Signed)
Patient is overdue for a visit.  Please schedule.

## 2023-11-10 NOTE — Telephone Encounter (Signed)
Appt scheduled 5/19

## 2023-11-14 ENCOUNTER — Ambulatory Visit: Payer: Self-pay | Admitting: Medical

## 2023-11-14 ENCOUNTER — Encounter: Payer: Self-pay | Admitting: Medical

## 2023-11-14 VITALS — BP 116/58 | HR 79 | Wt 146.8 lb

## 2023-11-14 DIAGNOSIS — F411 Generalized anxiety disorder: Secondary | ICD-10-CM

## 2023-11-14 DIAGNOSIS — R12 Heartburn: Secondary | ICD-10-CM

## 2023-11-14 DIAGNOSIS — G43109 Migraine with aura, not intractable, without status migrainosus: Secondary | ICD-10-CM

## 2023-11-14 MED ORDER — OMEPRAZOLE 20 MG PO CPDR: DELAYED_RELEASE_CAPSULE | ORAL | 0 refills | Status: AC

## 2023-11-14 MED ORDER — RIZATRIPTAN BENZOATE 10 MG PO TABS: ORAL_TABLET | ORAL | 0 refills | Status: DC

## 2023-11-14 MED ORDER — ONDANSETRON 4 MG PO TBDP: 4.0000 mg | ORAL_TABLET | Freq: Three times a day (TID) | ORAL | 2 refills | Status: AC | PRN

## 2023-11-14 NOTE — Progress Notes (Signed)
 Subjective:  Rhonda Green is a 53 y.o. female who presents for Chief Complaint  Patient presents with   Medication Refill    Medication ck Lexapro  and MAXALT      Here for med check.  Normally sees Rhonda Green here.  Has hx/o migraines, gets migraines a few times per month.  Overall doing well with maxalt  prn  Due for recheck on mood, anxiety and depression.  Doing fine on Lexapro .  Been this 3-4 years.  No issues  Working full time.   Works as early Actor.   Has seen counselor in the past , few years ago.  Not in counseling currently  Exeresis about 3 days per week including waking or jogging.  Has 3 grandchildren.  Uses omeprazole  20mg  daily without issue  No other aggravating or relieving factors.    No other c/o.  Past Medical History:  Diagnosis Date   Anxiety    Depression    GERD (gastroesophageal reflux disease)    Current Outpatient Medications on File Prior to Visit  Medication Sig Dispense Refill   escitalopram  (LEXAPRO ) 10 MG tablet TAKE 1 AND 1/2 TABLETS(15 MG) BY MOUTH DAILY 45 tablet 0   meloxicam  (MOBIC ) 15 MG tablet Take 1 tablet (15 mg total) by mouth daily as needed for pain. 30 tablet 0   tretinoin  (RETIN-A ) 0.025 % cream Apply topically at bedtime. 45 g 1   [DISCONTINUED] metoCLOPramide  (REGLAN ) 10 MG tablet Take 1 tablet (10 mg total) by mouth every 6 (six) hours as needed for nausea (nausea/headache). 6 tablet 0   No current facility-administered medications on file prior to visit.    The following portions of the patient's history were reviewed and updated as appropriate: allergies, current medications, past family history, past medical history, past social history, past surgical history and problem list.  ROS Otherwise as in subjective above  Objective: BP (!) 116/58   Pulse 79   Wt 146 lb 12.8 oz (66.6 kg)   SpO2 96%   BMI 21.68 kg/m   General appearance: alert, no distress, well developed, well nourished Neck: supple, no  lymphadenopathy, no thyromegaly, no masses Heart: RRR, normal S1, S2, no murmurs Lungs: CTA bilaterally, no wheezes, rhonchi, or rales Pulses: 2+ radial pulses, 2+ pedal pulses, normal cap refill Ext: no edema    Assessment: Encounter Diagnoses  Name Primary?   Heartburn    Migraine with aura and without status migrainosus, not intractable    GAD (generalized anxiety disorder) Yes     Plan: Hx/o anxiety - does fine on Lexapro  10mg  daily.  Continue current therapy  Heartburn - refilled omeprazole , but advised trial of OTC famotidine  Pepcid  as safer alternative given long term risks of PPI including osteoporosis  Migraines - uses maxalt  prn, headaches under control  Return soon for fasting well visit and update cancer screening.  Past due for mamogram, pap, etc.   Rhonda Green was seen today for medication refill.  Diagnoses and all orders for this visit:  GAD (generalized anxiety disorder)  Heartburn -     omeprazole  (PRILOSEC) 20 MG capsule; TAKE 1 CAPSULE(20 MG) BY MOUTH DAILY -     ondansetron  (ZOFRAN -ODT) 4 MG disintegrating tablet; Take 1 tablet (4 mg total) by mouth every 8 (eight) hours as needed for nausea or vomiting.  Migraine with aura and without status migrainosus, not intractable -     rizatriptan  (MAXALT ) 10 MG tablet; Take 1 tablet by mouth at the first sign of migraine. May repeat one time after  2 hours if no improvement. Do not take more than 2 times in 24 hours.    Follow up: in next 4 months for well visit fasting with PCP

## 2023-12-12 ENCOUNTER — Other Ambulatory Visit: Payer: Self-pay | Admitting: Nurse Practitioner

## 2023-12-12 DIAGNOSIS — F411 Generalized anxiety disorder: Secondary | ICD-10-CM

## 2023-12-12 DIAGNOSIS — F5104 Psychophysiologic insomnia: Secondary | ICD-10-CM

## 2023-12-16 ENCOUNTER — Other Ambulatory Visit: Payer: Self-pay | Admitting: Nurse Practitioner

## 2023-12-16 DIAGNOSIS — F411 Generalized anxiety disorder: Secondary | ICD-10-CM

## 2023-12-16 DIAGNOSIS — F5104 Psychophysiologic insomnia: Secondary | ICD-10-CM

## 2023-12-28 IMAGING — CT CT ABD-PELV W/ CM
2 of 5 series · 12 of 46 positions shown, 14 images · IV contrast (APPLIED)
Comparison: October 19, 2019
COMPARISON: October 19, 2019
COMPARISON: October 19, 2019

Addendum:
CLINICAL DATA: Currently being treated for UTI with shortness of
breath, chills and diaphoresis.

EXAM:
CT ABDOMEN AND PELVIS WITH CONTRAST
TECHNIQUE: Multidetector CT imaging of the abdomen and pelvis was performed
using the standard protocol following bolus administration of
intravenous contrast.

[Series 2: abd pel w · axial · 0.65mm/px · z∈[-771,-411]mm · 9 of 92 slices shown, 11 images]
[im 10/92  soft-tissue]
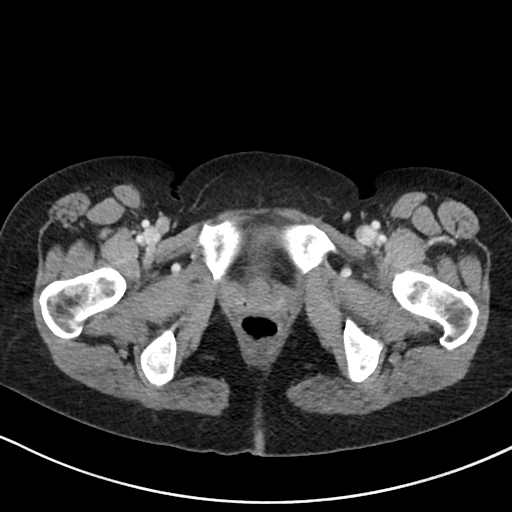
[im 10/92  bone]
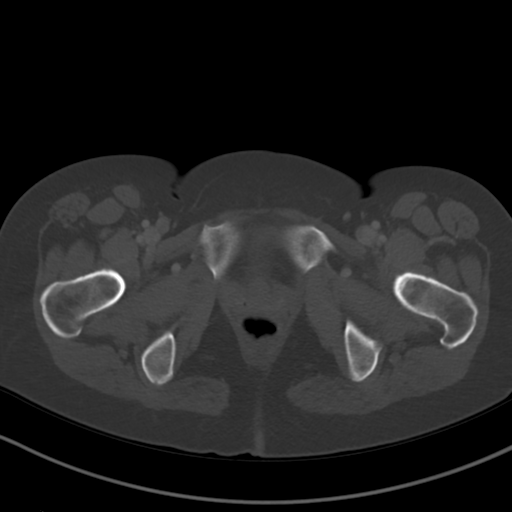
[im 19/92  soft-tissue]
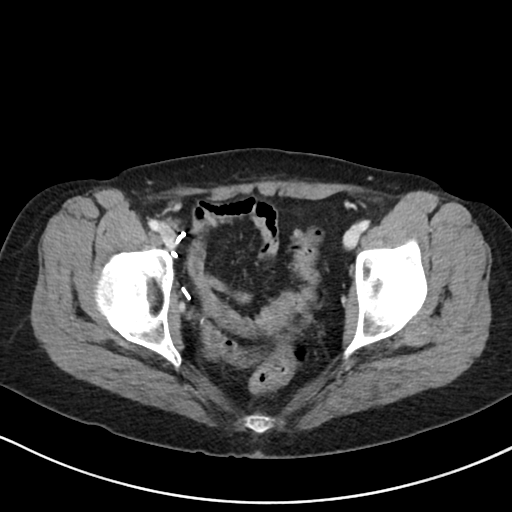
[im 28/92  soft-tissue]
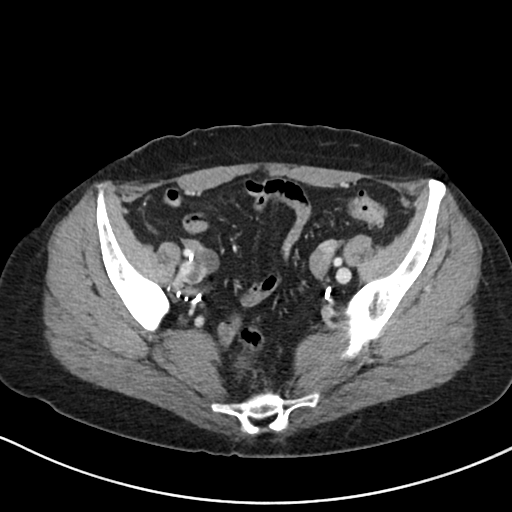
[im 37/92  soft-tissue]
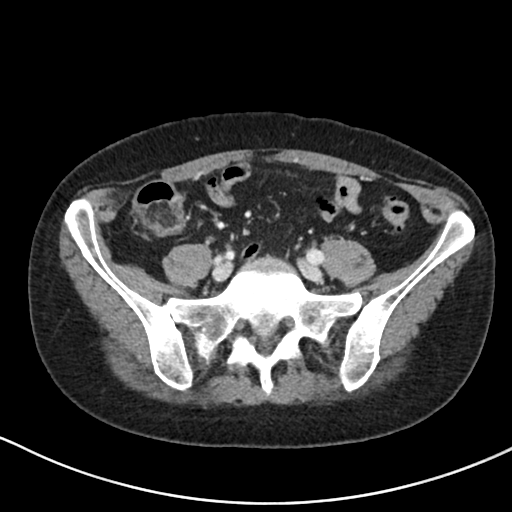
[im 46/92  soft-tissue]
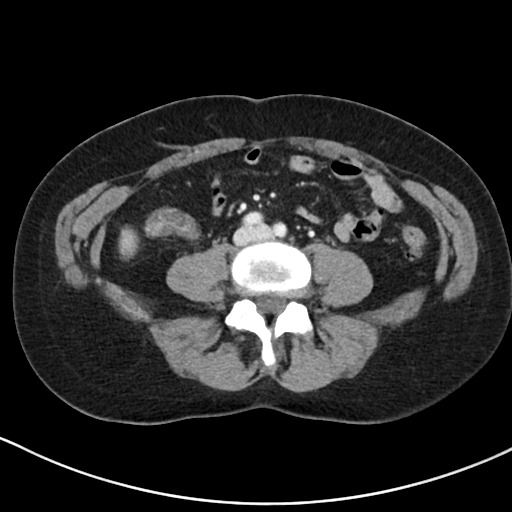
[im 55/92  soft-tissue]
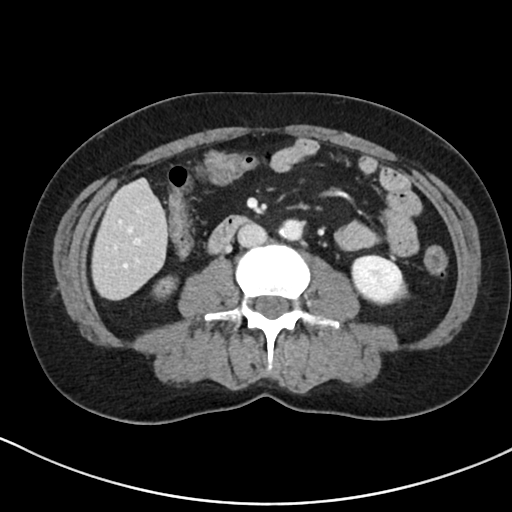
[im 64/92  soft-tissue]
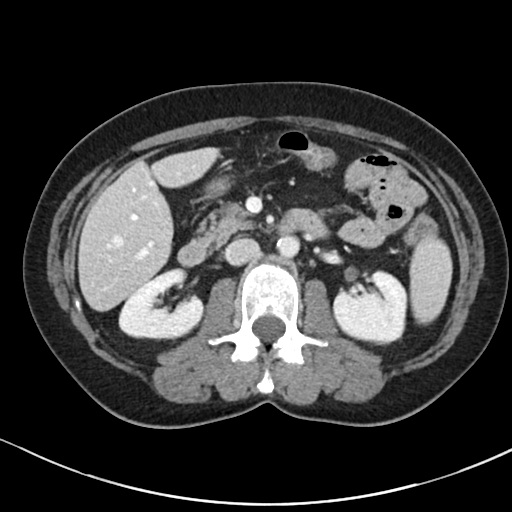
[im 73/92  soft-tissue]
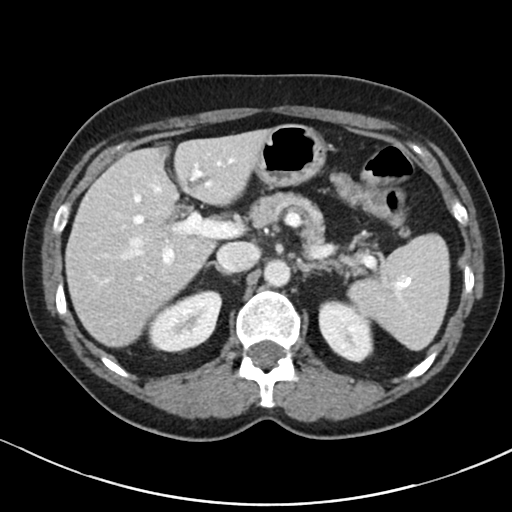
[im 82/92  soft-tissue]
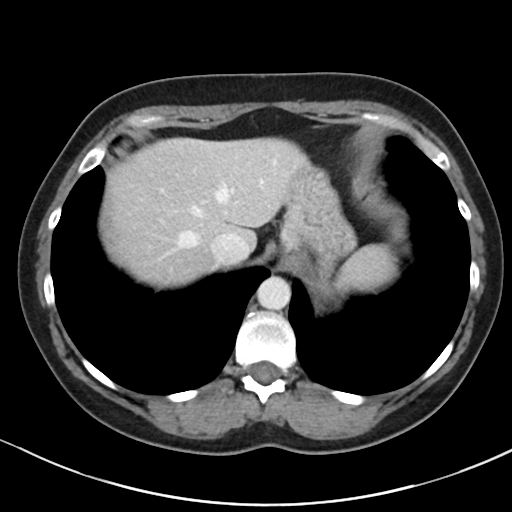
[im 82/92  bone]
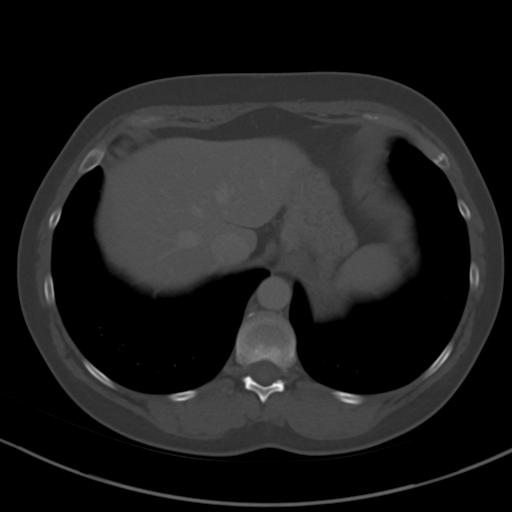

[Series 5: coronal · coronal · 0.68mm/px · 3 of 86 slices shown]
[im 29/86  soft-tissue]
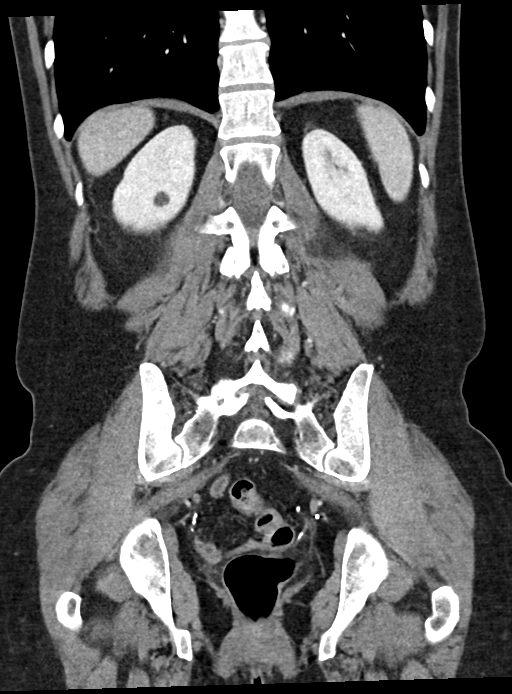
[im 38/86  soft-tissue]
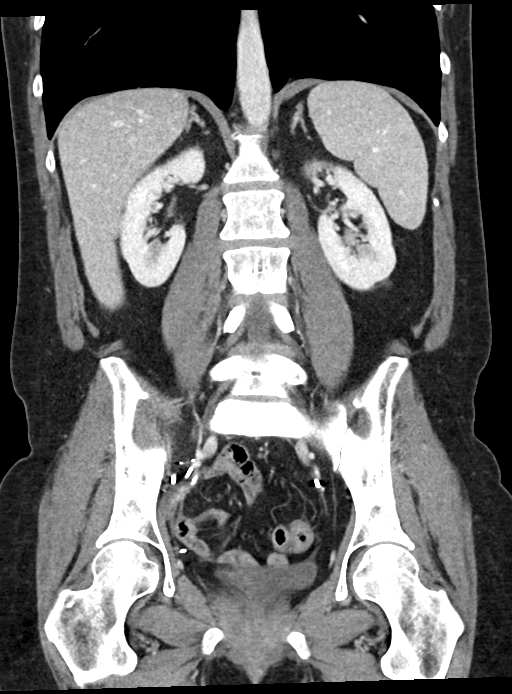
[im 48/86  soft-tissue]
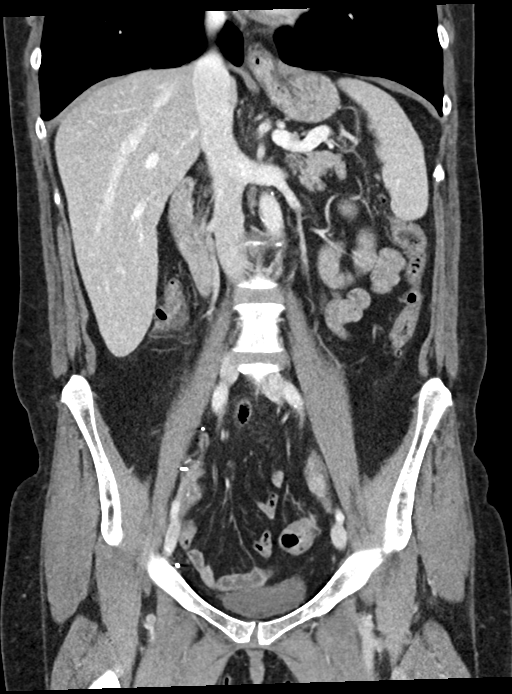

[12 of 46 positions shown; findings below may reference images not displayed]

RADIATION DOSE REDUCTION: This exam was performed according to the
departmental dose-optimization program which includes automated
exposure control, adjustment of the mA and/or kV according to
patient size and/or use of iterative reconstruction technique.

CONTRAST:  100mL OMNIPAQUE IOHEXOL 300 MG/ML  SOLN
FINDINGS: Lower chest: No acute abnormality.

Hepatobiliary: No focal liver abnormality is seen. Status post
cholecystectomy. No biliary dilatation.

Pancreas: Unremarkable. No pancreatic ductal dilatation or
surrounding inflammatory changes.

Spleen: Normal in size without focal abnormality.

Adrenals/Urinary Tract: Adrenal glands are unremarkable. Kidneys are
normal, without renal calculi or hydronephrosis. A 1.3 cm simple
cyst is seen within the posterior aspect of the mid right kidney.
Bladder is unremarkable.

Stomach/Bowel: Stomach is within normal limits. Appendix appears
normal. No evidence of bowel dilatation. Moderately thickened,
poorly distended ascending and proximal to mid transverse colon is
seen. Numerous diverticula are seen within the descending and
sigmoid colon. This area of large bowel is also poorly distended.

Vascular/Lymphatic: Very mild aortic atherosclerosis. No enlarged
abdominal or pelvic lymph nodes. Multiple surgical clips are seen
along the pelvic lymph node chains, bilaterally.

Reproductive: Status post hysterectomy. No adnexal masses.

Other: No abdominal wall hernia or abnormality. No abdominopelvic
ascites.

Musculoskeletal: No acute or significant osseous findings.
IMPRESSION: 1. Findings, as described above, suggestive of inflammatory or
infectious colitis involving non distended ascending and transverse
colon.
2. Colonic diverticulosis.
3. Evidence of prior cholecystectomy.

Aortic Atherosclerosis (HYZ6C-YP7.7).

ADDENDUM:
It should also be noted that the findings suggestive of colitis
involving the ascending and transverse colon may be secondary to the
patient's current medication for UTI.

ADDENDUM:
1.3 cm simple right renal cyst. No follow-up imaging is recommended.

*** End of Addendum ***
Addendum:
RADIATION DOSE REDUCTION: This exam was performed according to the
departmental dose-optimization program which includes automated
exposure control, adjustment of the mA and/or kV according to
patient size and/or use of iterative reconstruction technique.

CONTRAST:  100mL OMNIPAQUE IOHEXOL 300 MG/ML  SOLN
FINDINGS: Lower chest: No acute abnormality.

Hepatobiliary: No focal liver abnormality is seen. Status post
cholecystectomy. No biliary dilatation.

Pancreas: Unremarkable. No pancreatic ductal dilatation or
surrounding inflammatory changes.

Spleen: Normal in size without focal abnormality.

Adrenals/Urinary Tract: Adrenal glands are unremarkable. Kidneys are
normal, without renal calculi or hydronephrosis. A 1.3 cm simple
cyst is seen within the posterior aspect of the mid right kidney.
Bladder is unremarkable.

Stomach/Bowel: Stomach is within normal limits. Appendix appears
normal. No evidence of bowel dilatation. Moderately thickened,
poorly distended ascending and proximal to mid transverse colon is
seen. Numerous diverticula are seen within the descending and
sigmoid colon. This area of large bowel is also poorly distended.

Vascular/Lymphatic: Very mild aortic atherosclerosis. No enlarged
abdominal or pelvic lymph nodes. Multiple surgical clips are seen
along the pelvic lymph node chains, bilaterally.

Reproductive: Status post hysterectomy. No adnexal masses.

Other: No abdominal wall hernia or abnormality. No abdominopelvic
ascites.

Musculoskeletal: No acute or significant osseous findings.
IMPRESSION: 1. Findings, as described above, suggestive of inflammatory or
infectious colitis involving non distended ascending and transverse
colon.
2. Colonic diverticulosis.
3. Evidence of prior cholecystectomy.

Aortic Atherosclerosis (HYZ6C-YP7.7).

ADDENDUM:
It should also be noted that the findings suggestive of colitis
involving the ascending and transverse colon may be secondary to the
patient's current medication for UTI.

*** End of Addendum ***
RADIATION DOSE REDUCTION: This exam was performed according to the
departmental dose-optimization program which includes automated
exposure control, adjustment of the mA and/or kV according to
patient size and/or use of iterative reconstruction technique.

CONTRAST:  100mL OMNIPAQUE IOHEXOL 300 MG/ML  SOLN
FINDINGS: Lower chest: No acute abnormality.

Hepatobiliary: No focal liver abnormality is seen. Status post
cholecystectomy. No biliary dilatation.

Pancreas: Unremarkable. No pancreatic ductal dilatation or
surrounding inflammatory changes.

Spleen: Normal in size without focal abnormality.

Adrenals/Urinary Tract: Adrenal glands are unremarkable. Kidneys are
normal, without renal calculi or hydronephrosis. A 1.3 cm simple
cyst is seen within the posterior aspect of the mid right kidney.
Bladder is unremarkable.

Stomach/Bowel: Stomach is within normal limits. Appendix appears
normal. No evidence of bowel dilatation. Moderately thickened,
poorly distended ascending and proximal to mid transverse colon is
seen. Numerous diverticula are seen within the descending and
sigmoid colon. This area of large bowel is also poorly distended.

Vascular/Lymphatic: Very mild aortic atherosclerosis. No enlarged
abdominal or pelvic lymph nodes. Multiple surgical clips are seen
along the pelvic lymph node chains, bilaterally.

Reproductive: Status post hysterectomy. No adnexal masses.

Other: No abdominal wall hernia or abnormality. No abdominopelvic
ascites.

Musculoskeletal: No acute or significant osseous findings.
IMPRESSION: 1. Findings, as described above, suggestive of inflammatory or
infectious colitis involving non distended ascending and transverse
colon.
2. Colonic diverticulosis.
3. Evidence of prior cholecystectomy.

Aortic Atherosclerosis (HYZ6C-YP7.7).

## 2023-12-29 ENCOUNTER — Other Ambulatory Visit: Payer: Self-pay | Admitting: Nurse Practitioner

## 2023-12-29 DIAGNOSIS — G43109 Migraine with aura, not intractable, without status migrainosus: Secondary | ICD-10-CM

## 2024-01-16 ENCOUNTER — Other Ambulatory Visit: Payer: Self-pay | Admitting: Nurse Practitioner

## 2024-01-16 DIAGNOSIS — F5104 Psychophysiologic insomnia: Secondary | ICD-10-CM

## 2024-01-16 DIAGNOSIS — F411 Generalized anxiety disorder: Secondary | ICD-10-CM

## 2024-01-17 NOTE — Telephone Encounter (Signed)
 Last apt 11/14/23

## 2024-02-24 ENCOUNTER — Ambulatory Visit: Payer: Self-pay

## 2024-02-24 ENCOUNTER — Ambulatory Visit: Admission: EM | Admit: 2024-02-24 | Discharge: 2024-02-24 | Disposition: A | Discharge status: Home / Self Care

## 2024-02-24 ENCOUNTER — Other Ambulatory Visit: Payer: Self-pay

## 2024-02-24 ENCOUNTER — Encounter: Payer: Self-pay | Admitting: *Deleted

## 2024-02-24 DIAGNOSIS — N3001 Acute cystitis with hematuria: Secondary | ICD-10-CM

## 2024-02-24 DIAGNOSIS — R300 Dysuria: Secondary | ICD-10-CM | POA: Diagnosis present

## 2024-02-24 LAB — POCT URINE DIPSTICK
Glucose, UA: 100 mg/dL — AB
Nitrite, UA: POSITIVE — AB
POC PROTEIN,UA: >=300 — AB
Spec Grav, UA: >=1.03 — AB (ref 1.010–1.025)
Urobilinogen, UA: 2 U/dL — AB
pH, UA: 5 (ref 5.0–8.0)

## 2024-02-24 MED ORDER — PHENAZOPYRIDINE HCL 200 MG PO TABS: 200.0000 mg | ORAL_TABLET | ORAL | 0 refills | Status: AC

## 2024-02-24 MED ORDER — NITROFURANTOIN MONOHYD MACRO 100 MG PO CAPS: 100.0000 mg | ORAL_CAPSULE | Freq: Two times a day (BID) | ORAL | 0 refills | Status: DC

## 2024-02-24 NOTE — Telephone Encounter (Signed)
 Noted,  Thank you!

## 2024-02-24 NOTE — Telephone Encounter (Signed)
 FYI Only or Action Required?: FYI only for provider.  Patient was last seen in primary care on 11/14/2023 by Rhonda Alm RAMAN, PA-C.  Called Nurse Triage reporting Dysuria.  Symptoms began 2 days ago.  Symptoms are: gradually worsening.  Triage Disposition: See Physician Within 24 Hours  Patient/caregiver understands and will follow disposition?: No appointment, going to urgent care       Copied from CRM #[id number]. Topic: Clinical - Red Word Triage >> Feb 24, 2024  1:43 PM Graeme ORN wrote: Red Word that prompted transfer to Nurse Triage: Urination - burning pain and frequent        Reason for Disposition  All other patients with painful urination  (Exception: [1] EITHER frequency or urgency AND [2] has on-call doctor.)  Answer Assessment - Initial Assessment Questions 1. SEVERITY: How bad is the pain?  (e.g., Scale 1-10; mild, moderate, or severe)     Moderate  2. FREQUENCY: How many times have you had painful urination today?      Very frequent  3. PATTERN: Is pain present every time you urinate or just sometimes?      Every urination  4. ONSET: When did the painful urination start?      2 days ago  5. FEVER: Do you have a fever? If Yes, ask: What is your temperature, how was it measured, and when did it start?     No 6. PAST UTI: Have you had a urine infection before? If Yes, ask: When was the last time? and What happened that time?      Yes 7. CAUSE: What do you think is causing the painful urination?  (e.g., UTI, scratch, Herpes sore)     UTI 8. OTHER SYMPTOMS: Do you have any other symptoms? (e.g., blood in urine, flank pain, genital sores, urgency, vaginal discharge)     Frequency and urgency  Protocols used: Urination Pain - Female-A-AH

## 2024-02-24 NOTE — ED Provider Notes (Signed)
 [name]    CSN: [id number] Arrival date & time: 02/24/24  1427      History   Chief Complaint Chief Complaint  Patient presents with   Dysuria    HPI Rhonda Green is a 53 y.o. female.   Discussed the use of AI scribe software for clinical note transcription with the patient, who gave verbal consent to proceed.   The patient presents with symptoms consistent with a urinary tract infection (UTI). She reports increased urinary frequency with small amounts of urine. The patient has been self-medicating with over-the-counter Azo, in which she just started today. She denies experiencing fever, nausea, vomiting, or back pain associated with her urinary symptoms. She has a history of hysterectomy and is no longer menstruating. The patient reports an intolerance to Cipro , which causes diarrhea, nausea, and vomiting.  The following portions of the patient's history were reviewed and updated as appropriate: allergies, current medications, past family history, past medical history, past social history, past surgical history, and problem list.    Past Medical History:  Diagnosis Date   Anxiety    Depression    GERD (gastroesophageal reflux disease)     Patient Active Problem List   Diagnosis Date Noted   Irritable bowel syndrome with both constipation and diarrhea 10/04/2022   Psychophysiological insomnia 10/04/2022   Atypical chest pain 10/04/2022   Heartburn 01/18/2022   Migraine with aura and without status migrainosus, not intractable 01/18/2022   GAD (generalized anxiety disorder) 02/05/2015    Past Surgical History:  Procedure Laterality Date   ABDOMINAL HYSTERECTOMY     CESAREAN SECTION     CHOLECYSTECTOMY      OB History   No obstetric history on file.      Home Medications    Prior to Admission medications   Medication Sig Start Date End Date Taking? Authorizing Provider  escitalopram  (LEXAPRO ) 10 MG tablet TAKE 1 AND 1/2 TABLETS(15 MG) BY  MOUTH DAILY 01/18/24  Yes Early, Sara E, NP  nitrofurantoin , macrocrystal-monohydrate, (MACROBID ) 100 MG capsule Take 1 capsule (100 mg total) by mouth 2 (two) times daily. 02/24/24  Yes Iola Lukes, FNP  omeprazole  (PRILOSEC) 20 MG capsule TAKE 1 CAPSULE(20 MG) BY MOUTH DAILY 11/14/23  Yes Tysinger, Alm RAMAN, PA-C  phenazopyridine  (PYRIDIUM ) 200 MG tablet Take 1 tablet (200 mg total) by mouth 3 (three) times daily at 8am, 3pm and bedtime for 2 days. 02/24/24 02/26/24 Yes Kristian Mogg, Lukes, FNP  rizatriptan  (MAXALT ) 10 MG tablet TAKE 1 TABLET BY MOUTH AT THE FIRST SIGN OF MIGRAINE. MAY REPEAT ONE TIME AFTER 2 HOURS IF NO IMPROVEMENT. DO NOT TAKE MORE THAN 2 TABS IN 24HRS 12/29/23  Yes Early, Sara E, NP  tretinoin  (RETIN-A ) 0.025 % cream Apply topically at bedtime. 01/20/22  Yes Kip Ade, NP  amoxicillin  (AMOXIL ) 500 MG capsule Take 500 mg by mouth 3 (three) times daily. Patient not taking: Reported on 02/24/2024 10/14/23   [provider]  ibuprofen (ADVIL) 800 MG tablet Take 800 mg by mouth every 8 (eight) hours as needed. Patient not taking: Reported on 02/24/2024 10/14/23   [provider]  meloxicam  (MOBIC ) 15 MG tablet Take 1 tablet (15 mg total) by mouth daily as needed for pain. Patient not taking: Reported on 02/24/2024 09/19/23   Leonce Katz, DO  ondansetron  (ZOFRAN -ODT) 4 MG disintegrating tablet Take 1 tablet (4 mg total) by mouth every 8 (eight) hours as needed for nausea or vomiting. Patient not taking: Reported on 02/24/2024 11/14/23  Tysinger, Alm RAMAN, PA-C  traMADol  (ULTRAM ) 50 MG tablet Take 50 mg by mouth every 8 (eight) hours as needed. Patient not taking: Reported on 02/24/2024 10/14/23   [provider]  metoCLOPramide  (REGLAN ) 10 MG tablet Take 1 tablet (10 mg total) by mouth every 6 (six) hours as needed for nausea (nausea/headache). 10/19/19 10/19/19  Arloa Chroman, PA-C    Family History Family History  Problem Relation Age of Onset    Diabetes Mother    Hypertension Mother    Breast cancer Mother 55   Anuerysm Father 82    Social History Social History   Tobacco Use   Smoking status: Never   Smokeless tobacco: Never  Vaping Use   Vaping status: Never Used  Substance Use Topics   Alcohol use: No    Alcohol/week: 0.0 standard drinks of alcohol   Drug use: No     Allergies   Ciprofloxacin , Morphine , and Morphine  and codeine   Review of Systems Review of Systems  Constitutional:  Negative for fever.  Gastrointestinal:  Negative for nausea and vomiting.  Genitourinary:  Positive for dysuria and frequency.  Musculoskeletal:  Negative for back pain.  All other systems reviewed and are negative.    Physical Exam Triage Vital Signs ED Triage Vitals  Encounter Vitals Group     BP 02/24/24 1710 127/86     Girls Systolic BP Percentile --      Girls Diastolic BP Percentile --      Boys Systolic BP Percentile --      Boys Diastolic BP Percentile --      Pulse Rate 02/24/24 1710 80     Resp 02/24/24 1710 16     Temp 02/24/24 1710 98.2 F (36.8 C)     Temp Source 02/24/24 1710 Oral     SpO2 02/24/24 1710 98 %     Weight --      Height --      Head Circumference --      Peak Flow --      Pain Score 02/24/24 1707 4     Pain Loc --      Pain Education --      Exclude from Growth Chart --    No data found.  Updated Vital Signs BP 127/86 (BP Location: Left Arm)   Pulse 80   Temp 98.2 F (36.8 C) (Oral)   Resp 16   SpO2 98%   Visual Acuity Right Eye Distance:   Left Eye Distance:   Bilateral Distance:    Right Eye Near:   Left Eye Near:    Bilateral Near:     Physical Exam Vitals reviewed.  Constitutional:      General: She is awake. She is not in acute distress.    Appearance: Normal appearance. She is well-developed. She is not ill-appearing, toxic-appearing or diaphoretic.  HENT:     Head: Normocephalic.     Right Ear: Hearing normal.     Left Ear: Hearing normal.     Nose: Nose  normal.     Mouth/Throat:     Mouth: Mucous membranes are moist.  Eyes:     General: Vision grossly intact.     Conjunctiva/sclera: Conjunctivae normal.  Cardiovascular:     Rate and Rhythm: Normal rate and regular rhythm.     Heart sounds: Normal heart sounds.  Pulmonary:     Effort: Pulmonary effort is normal.     Breath sounds: Normal breath sounds and air entry.  Musculoskeletal:        General: Normal range of motion.     Cervical back: Full passive range of motion without pain, normal range of motion and neck supple.  Skin:    General: Skin is warm and dry.  Neurological:     General: No focal deficit present.     Mental Status: She is alert and oriented to person, place, and time.  Psychiatric:        Speech: Speech normal.        Behavior: Behavior is cooperative.      UC Treatments / Results  Labs (all labs ordered are listed, but only abnormal results are displayed) Labs Reviewed  POCT URINE DIPSTICK - Abnormal; Notable for the following components:      Result Value   Color, UA orange (*)    Glucose, UA =100 (*)    Bilirubin, UA small (*)    Ketones, POC UA trace (5) (*)    Spec Grav, UA >=1.030 (*)    Blood, UA moderate (*)    POC PROTEIN,UA >=300 (*)    Urobilinogen, UA 2.0 (*)    Nitrite, UA Positive (*)    Leukocytes, UA Large (3+) (*)    All other components within normal limits  URINE CULTURE    EKG   Radiology No results found.  Procedures Procedures (including critical care time)  Medications Ordered in UC Medications - No data to display  Initial Impression / Assessment and Plan / UC Course  I have reviewed the triage vital signs and the nursing notes.  Pertinent labs & imaging results that were available during my care of the patient were reviewed by me and considered in my medical decision making (see chart for details).     Patient presents with symptoms consistent with a urinary tract infection. Urinalysis reveals microscopic  hematuria, positive nitrites, and leukocyte esterase, supporting the diagnosis. Nitrofurantoin  was prescribed to be taken twice daily for 5 days. Pyridium  was prescribed for urinary discomfort, to be taken three times daily for 2 days, with counseling that it may turn the urine orange. A urine culture was sent to identify the causative organism and assess antibiotic sensitivity. Patient was advised that they will be contacted only if the culture results require a change in treatment; otherwise, results can be reviewed via MyChart. Patient instructed to increase fluid intake and monitor symptoms. Follow up with primary care provider if symptoms do not improve or worsen. Emergency evaluation is warranted for fever, back or flank pain, nausea, vomiting, or signs of systemic illness.  Today's evaluation has revealed no signs of a dangerous process. Discussed diagnosis with patient and/or guardian. Patient and/or guardian aware of their diagnosis, possible red flag symptoms to watch out for and need for close follow up. Patient and/or guardian understands verbal and written discharge instructions. Patient and/or guardian comfortable with plan and disposition.  Patient and/or guardian has a clear mental status at this time, good insight into illness (after discussion and teaching) and has clear judgment to make decisions regarding their care  Documentation was completed with the aid of voice recognition software. Transcription may contain typographical errors.  Final Clinical Impressions(s) / UC Diagnoses   Final diagnoses:  Acute cystitis with hematuria     Discharge Instructions      You were seen today for symptoms consistent with a urinary tract infection (UTI). You have been prescribed Macrobid  to treat the infection and Pyridium  to help relieve discomfort such as burning,  urgency, and bladder pressure. Take the antibiotics exactly as prescribed and complete the full course, even if you start feeling  better. Pyridium   may cause your urine to change color, which is a normal side effect of the medication. A urine culture has been sent to identify the specific bacteria causing the infection and to confirm that the prescribed antibiotic is appropriate. You will only be contacted if your results are abnormal; otherwise, you may review them in your MyChart account.   It is important to stay well hydrated by drinking plenty of fluids throughout the day. This helps flush out your urinary system and keeps your urine light yellow, which is a sign of good hydration. Avoid caffeine and alcohol, as they can irritate the bladder. Be sure to urinate regularly and empty your bladder fully. Do not hold your urine for extended periods. Always wipe from front to back after using the bathroom and use a clean tissue for each wipe. It is also important to urinate after sexual activity. Avoid douching or using sprays or powders in the genital area, as these can cause irritation. Follow up with your healthcare provider if your symptoms do not improve within a few days, get worse, or return after completing your treatment.     ED Prescriptions     Medication Sig Dispense Auth. Provider   nitrofurantoin , macrocrystal-monohydrate, (MACROBID ) 100 MG capsule Take 1 capsule (100 mg total) by mouth 2 (two) times daily. 10 capsule Iola Lukes, FNP   phenazopyridine  (PYRIDIUM ) 200 MG tablet Take 1 tablet (200 mg total) by mouth 3 (three) times daily at 8am, 3pm and bedtime for 2 days. 6 tablet Iola Lukes, FNP      PDMP not reviewed this encounter.   Iola Lukes, [place] 02/24/24 [phone]

## 2024-02-24 NOTE — ED Triage Notes (Signed)
 Pt reports UTI Sx x 2 days, burning and frequency. Urinating small amounts. She has taken AZO with some relief of pain.

## 2024-02-24 NOTE — Discharge Instructions (Addendum)
 You were seen today for symptoms consistent with a urinary tract infection (UTI). You have been prescribed Macrobid  to treat the infection and Pyridium  to help relieve discomfort such as burning, urgency, and bladder pressure. Take the antibiotics exactly as prescribed and complete the full course, even if you start feeling better. Pyridium  may cause your urine to change color, which is a normal side effect of the medication. A urine culture has been sent to identify the specific bacteria causing the infection and to confirm that the prescribed antibiotic is appropriate. You will only be contacted if your results are abnormal; otherwise, you may review them in your MyChart account.   It is important to stay well hydrated by drinking plenty of fluids throughout the day. This helps flush out your urinary system and keeps your urine light yellow, which is a sign of good hydration. Avoid caffeine and alcohol, as they can irritate the bladder. Be sure to urinate regularly and empty your bladder fully. Do not hold your urine for extended periods. Always wipe from front to back after using the bathroom and use a clean tissue for each wipe. It is also important to urinate after sexual activity. Avoid douching or using sprays or powders in the genital area, as these can cause irritation. Follow up with your healthcare provider if your symptoms do not improve within a few days, get worse, or return after completing your treatment.

## 2024-02-26 LAB — URINE CULTURE: Culture: >=100000 — AB

## 2024-02-28 ENCOUNTER — Ambulatory Visit (HOSPITAL_COMMUNITY): Payer: Self-pay

## 2024-06-15 ENCOUNTER — Ambulatory Visit: Payer: Self-pay

## 2024-06-15 NOTE — Telephone Encounter (Signed)
 Unable to reach patient. Left message that we had no openings left for the day but she could call her eye doctor or go to nearest UC to be checked out if she was having issues

## 2024-06-15 NOTE — Telephone Encounter (Signed)
 Call disconnected prior to transfer to nurse, placed outbound call . Unable to reach left message requesting call back     Copied from CRM #[id number]. Topic: Clinical - Red Word Triage >> Jun 15, 2024  1:43 PM Fonda T wrote: [facility] that prompted transfer to Nurse Triage: Pr calling, states within last hour she had a blood vessel  rupture/burst in her right eye, she states she can't tell if her vision has been affected, and denies any symptoms of pain or decreased vision at the present.  Pt requesting to speak to nurse.

## 2024-07-04 ENCOUNTER — Ambulatory Visit: Admitting: Family Medicine

## 2024-07-04 ENCOUNTER — Encounter: Payer: Self-pay | Admitting: Family Medicine

## 2024-07-04 ENCOUNTER — Encounter: Payer: Self-pay | Admitting: Nurse Practitioner

## 2024-07-04 VITALS — BP 110/68 | HR 80 | Temp 99.4°F | Ht 69.0 in | Wt 122.4 lb

## 2024-07-04 DIAGNOSIS — N3941 Urge incontinence: Secondary | ICD-10-CM | POA: Diagnosis not present

## 2024-07-04 DIAGNOSIS — N951 Menopausal and female climacteric states: Secondary | ICD-10-CM | POA: Diagnosis not present

## 2024-07-04 DIAGNOSIS — R300 Dysuria: Secondary | ICD-10-CM | POA: Diagnosis not present

## 2024-07-04 DIAGNOSIS — N390 Urinary tract infection, site not specified: Secondary | ICD-10-CM | POA: Diagnosis not present

## 2024-07-04 LAB — POCT URINALYSIS DIP (PROADVANTAGE DEVICE)
Glucose, UA: NEGATIVE mg/dL
Ketones, POC UA: NEGATIVE mg/dL
Leukocytes, UA: NEGATIVE
Nitrite, UA: NEGATIVE
Protein Ur, POC: NEGATIVE mg/dL
Specific Gravity, Urine: 1.025
Urobilinogen, Ur: 0.2
pH, UA: 6

## 2024-07-04 MED ORDER — NITROFURANTOIN MONOHYD MACRO 100 MG PO CAPS: ORAL_CAPSULE | ORAL | 1 refills | Status: AC

## 2024-07-04 NOTE — Patient Instructions (Signed)
" °  We are going to start you on nitrofurantoin . Take this antibiotics after intercourse. Continue to try and clean prior, and void within 20 minutes after intercourse. If you develop full blown UTI symptoms, you can take the nitrofurantoin  twice daily for 5-7 days. If your symptoms don't improve, then another antibiotic will be needed. Hopefully this will help prevent some infections, while waiting to see the urologist.  "

## 2024-07-04 NOTE — Progress Notes (Signed)
 Chief Complaint  Patient presents with   Consult    Patient gets frequent UTI's and seems like abx are finished they come right back. UC suggested Uro/Gyn consult-needs referral and they are about 3 months out. In the mean time she has heard that estrogen creams and preventative abx help and would like to know if you can rx either/both until she can into uro/gyn.    12/23 she went to UC with UTI sx x 2d. She was treated with cephalexin  x 7 days and symptoms resolved. 12/27--she was advised that culture showed proper antibiotic was prescribed, and advised to f/u with urology or urogynecology d/t frequent UTIs  Culture--E. Coli and Klebsiella  Antibiotic                 RSLT#1    RSLT#2    RSLT#3    RSLT#4 Amoxicillin /Clavulanic Acid    I         S Ampicillin                     R         R Cefazolin                      S         S Cefepime                       S         S Cefoxitin                      S         S Cefpodoxime                    S         S Ceftriaxone                     S         S Ciprofloxacin                   S         S Ertapenem                      S         S Gentamicin                     R         S Levofloxacin                   S         S Meropenem                      S         S Nitrofurantoin                  S         S Piperacillin/Tazobactam        S         S Tetracycline                   R         S Tobramycin                     S  S Trimethoprim /Sulfa              R         S   H/o frequent UTI's for many years.  It hadn't been as bad in the last couple of years, but is having symptoms/infections every few weeks since being sexually active in the last 5 months.  She does bathe before and after, and voids after intercourse.   Chart reviewed:  urine culture from 01/2024 e.coli, resistant to amp, amp/sulbactam, gent, and TMP/sulfa . All others were sensitive.   PMH, PSH, SH reviewed  Lymphedema [facility]  Outpatient Encounter Medications as of 07/04/2024  Medication Sig Note   omeprazole  (PRILOSEC) 20 MG capsule TAKE 1 CAPSULE(20 MG) BY MOUTH DAILY    ondansetron  (ZOFRAN -ODT) 4 MG disintegrating tablet Take 1 tablet (4 mg total) by mouth every 8 (eight) hours as needed for nausea or vomiting. 07/04/2024: As needed for vomiting   rizatriptan  (MAXALT ) 10 MG tablet TAKE 1 TABLET BY MOUTH AT THE FIRST SIGN OF MIGRAINE. MAY REPEAT ONE TIME AFTER 2 HOURS IF NO IMPROVEMENT. DO NOT TAKE MORE THAN 2 TABS IN 24HRS    tretinoin  (RETIN-A ) 0.025 % cream Apply topically at bedtime.    [DISCONTINUED] escitalopram  (LEXAPRO ) 10 MG tablet TAKE 1 AND 1/2 TABLETS(15 MG) BY MOUTH DAILY    [DISCONTINUED] amoxicillin  (AMOXIL ) 500 MG capsule Take 500 mg by mouth 3 (three) times daily. (Patient not taking: Reported on 02/24/2024)    [DISCONTINUED] ibuprofen (ADVIL) 800 MG tablet Take 800 mg by mouth every 8 (eight) hours as needed. (Patient not taking: Reported on 02/24/2024)    [DISCONTINUED] meloxicam  (MOBIC ) 15 MG tablet Take 1 tablet (15 mg total) by mouth daily as needed for pain. (Patient not taking: Reported on 02/24/2024)    [DISCONTINUED] metoCLOPramide  (REGLAN ) 10 MG tablet Take 1 tablet (10 mg total) by mouth every 6 (six) hours as needed for nausea (nausea/headache).    [DISCONTINUED] nitrofurantoin , macrocrystal-monohydrate, (MACROBID ) 100 MG capsule Take 1 capsule (100 mg total) by mouth 2 (two) times daily.    [DISCONTINUED] traMADol  (ULTRAM ) 50 MG tablet Take 50 mg by mouth every 8 (eight) hours as needed. (Patient not taking: Reported on 02/24/2024)    No facility-administered encounter medications on file as of 07/04/2024.   Allergies  Allergen Reactions   Ciprofloxacin  Diarrhea and Nausea And Vomiting   Morphine  Nausea And Vomiting   Morphine  And Codeine Nausea And Vomiting    ROS: slight sore throat last night, denies URI symptoms today. No known fever, chills, flank or abdominal pain. Hot  flashes improved, some months still gets breast tenderness. No vaginal discharge or itching. +urge urinary incontinence, and lately notes air coming out of the urethra.   PHYSICAL EXAM:  BP 110/68   Pulse 80   Temp 99.4 F (37.4 C) (Tympanic)   Ht 5' 9 (1.753 m)   Wt 122 lb 6.4 oz (55.5 kg)   BMI 18.08 kg/m   Pleasant, thin, well-appearing female in good spirits HEENT: conjunctiva and sclera are clear, EOMI. OP is clear, sinuses nontender. Neck: no lymphadenopathy or mass Heart: regular rate and rhythm, no murmur Lungs: clear bilaterally Back: no CVA tenderness Abdomen: soft, nontender, no mass Extremities: L ankle swollen--lymphedema, nonpitting. nontender  Urine: small bili, SG 1.025, small blood, otherwise negative.   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  Intermittent breast tenderness. Hot flashes not bad. Suspect perimenopausal.  Instructed on proper way to do Kegels. Reviewed causes for frequent UTI's, reviewed proper hygiene, avoiding holding urine for long periods. Discussed what eval might be done by urogyn/urology. Answered questions re: topical estrogens. Pelvic exam not performed today. Given only perimenopausal, and prior h/o frequent UTI's when younger, I do not think that topical estrogen would be my first choice. Will allow specialists to assess (with exam) and discuss that with her, if appropriate.  I spent 35 minutes dedicated to the care of this patient, including pre-visit review of  records, face to face time, post-visit ordering of testing and documentation.   We are going to start you on nitrofurantoin . Take this antibiotics after intercourse. Continue to try and clean prior, and void within 20 minutes after intercourse. If you develop full blown UTI symptoms, you can take the nitrofurantoin  twice daily for 5-7 days. If your symptoms don't improve, then another antibiotic will be needed. Hopefully this will help prevent some infections, while waiting to see the urologist.

## 2024-07-07 LAB — URINE CULTURE

## 2024-07-08 ENCOUNTER — Ambulatory Visit: Payer: Self-pay | Admitting: Family Medicine
